# Patient Record
Sex: Male | Born: 1945 | Race: White | Hispanic: No | Marital: Married | Smoking: Never smoker
Health system: Southern US, Community
[De-identification: ages and names within clinical notes are randomized; demographics above are authoritative.]

## PROBLEM LIST (undated history)

## (undated) DIAGNOSIS — R351 Nocturia: Secondary | ICD-10-CM

## (undated) DIAGNOSIS — R3915 Urgency of urination: Secondary | ICD-10-CM

## (undated) DIAGNOSIS — Q211 Atrial septal defect: Secondary | ICD-10-CM

## (undated) DIAGNOSIS — Z973 Presence of spectacles and contact lenses: Secondary | ICD-10-CM

## (undated) DIAGNOSIS — R011 Cardiac murmur, unspecified: Secondary | ICD-10-CM

## (undated) DIAGNOSIS — Z794 Long term (current) use of insulin: Secondary | ICD-10-CM

## (undated) DIAGNOSIS — E119 Type 2 diabetes mellitus without complications: Secondary | ICD-10-CM

## (undated) DIAGNOSIS — I350 Nonrheumatic aortic (valve) stenosis: Secondary | ICD-10-CM

## (undated) DIAGNOSIS — Q2112 Patent foramen ovale: Secondary | ICD-10-CM

## (undated) DIAGNOSIS — N471 Phimosis: Secondary | ICD-10-CM

## (undated) DIAGNOSIS — I10 Essential (primary) hypertension: Secondary | ICD-10-CM

## (undated) DIAGNOSIS — N433 Hydrocele, unspecified: Secondary | ICD-10-CM

## (undated) DIAGNOSIS — E785 Hyperlipidemia, unspecified: Secondary | ICD-10-CM

## (undated) DIAGNOSIS — K430 Incisional hernia with obstruction, without gangrene: Secondary | ICD-10-CM

## (undated) DIAGNOSIS — N489 Disorder of penis, unspecified: Secondary | ICD-10-CM

## (undated) HISTORY — PX: OTHER SURGICAL HISTORY: SHX169

## (undated) HISTORY — PX: TRANSTHORACIC ECHOCARDIOGRAM: SHX275

## (undated) HISTORY — PX: CARDIAC CATHETERIZATION: SHX172

---

## 2015-06-15 ENCOUNTER — Ambulatory Visit: Payer: Self-pay | Admitting: Internal Medicine

## 2015-07-03 ENCOUNTER — Ambulatory Visit: Payer: Self-pay | Admitting: Internal Medicine

## 2015-07-22 ENCOUNTER — Other Ambulatory Visit: Payer: Self-pay | Admitting: General Surgery

## 2015-07-22 DIAGNOSIS — K432 Incisional hernia without obstruction or gangrene: Secondary | ICD-10-CM

## 2015-07-24 ENCOUNTER — Ambulatory Visit: Payer: Self-pay | Admitting: Internal Medicine

## 2015-07-27 ENCOUNTER — Inpatient Hospital Stay: Admission: RE | Admit: 2015-07-27 | Payer: Self-pay | Source: Ambulatory Visit

## 2015-07-29 ENCOUNTER — Ambulatory Visit
Admission: RE | Admit: 2015-07-29 | Discharge: 2015-07-29 | Disposition: A | Discharge status: Home / Self Care | Payer: BLUE CROSS/BLUE SHIELD | Source: Ambulatory Visit | Attending: General Surgery | Admitting: General Surgery

## 2015-07-29 DIAGNOSIS — K432 Incisional hernia without obstruction or gangrene: Secondary | ICD-10-CM

## 2015-07-29 MED ORDER — IOPAMIDOL (ISOVUE-300) INJECTION 61%
125.0000 mL | Freq: Once | INTRAVENOUS | Status: AC | PRN
  Administered 2015-07-29: 125 mL via INTRAVENOUS

## 2015-08-12 ENCOUNTER — Ambulatory Visit: Payer: Self-pay | Admitting: Internal Medicine

## 2015-09-04 ENCOUNTER — Encounter: Payer: Self-pay | Admitting: Internal Medicine

## 2015-09-04 ENCOUNTER — Ambulatory Visit (INDEPENDENT_AMBULATORY_CARE_PROVIDER_SITE_OTHER): Payer: BLUE CROSS/BLUE SHIELD | Admitting: Internal Medicine

## 2015-09-04 VITALS — BP 124/66 | HR 85 | Temp 98.2°F | Resp 12 | Ht 71.0 in | Wt 223.6 lb

## 2015-09-04 DIAGNOSIS — IMO0002 Reserved for concepts with insufficient information to code with codable children: Secondary | ICD-10-CM

## 2015-09-04 DIAGNOSIS — Z794 Long term (current) use of insulin: Secondary | ICD-10-CM | POA: Diagnosis not present

## 2015-09-04 DIAGNOSIS — E1165 Type 2 diabetes mellitus with hyperglycemia: Secondary | ICD-10-CM | POA: Diagnosis not present

## 2015-09-04 DIAGNOSIS — E1142 Type 2 diabetes mellitus with diabetic polyneuropathy: Secondary | ICD-10-CM | POA: Diagnosis not present

## 2015-09-04 LAB — COMPLETE METABOLIC PANEL WITH GFR
ALBUMIN: 4 g/dL (ref 3.6–5.1)
ALK PHOS: 42 U/L (ref 40–115)
ALT: 11 U/L (ref 9–46)
AST: 11 U/L (ref 10–35)
BILIRUBIN TOTAL: 0.3 mg/dL (ref 0.2–1.2)
BUN: 24 mg/dL (ref 7–25)
CALCIUM: 9.1 mg/dL (ref 8.6–10.3)
CO2: 25 mmol/L (ref 20–31)
CREATININE: 1.38 mg/dL — AB (ref 0.70–1.25)
Chloride: 103 mmol/L (ref 98–110)
GFR, Est African American: 60 mL/min (ref >=60)
GFR, Est Non African American: 52 mL/min — ABNORMAL LOW (ref >=60)
GLUCOSE: 110 mg/dL — AB (ref 65–99)
POTASSIUM: 3.9 mmol/L (ref 3.5–5.3)
SODIUM: 140 mmol/L (ref 135–146)
TOTAL PROTEIN: 6.5 g/dL (ref 6.1–8.1)

## 2015-09-04 MED ORDER — METFORMIN HCL 500 MG PO TABS: 1000.0000 mg | ORAL_TABLET | Freq: Two times a day (BID) | ORAL | Status: DC

## 2015-09-04 NOTE — Progress Notes (Addendum)
Patient ID: Elijah Chen, male   DOB: July 04, 1946, 70 y.o.   MRN: [id number]  HPI: Elijah Chen is a 70 y.o.-year-old male, referred by his PCP, Dr.Barnes, for management of DM2, dx in 2006, insulin-dependent since dx, uncontrolled, with complications (peripheral neuropathy). He moved here from [place], where he was followed by endocrinology.  Last hemoglobin A1c was: 04/2015: HbA1c 13% 2015: Hba1c 7%  Pt is on a regimen of: - Lantus 36 units at bedtime - Novolog 13 units 3x a day, before meals - started at the end of last year - Glipizide 1x a day in am  Pt checks his sugars 1x a day and they are (ave 222 for 14 days) - am: 200-250, 270 - 2h after b'fast: n/c - before lunch: 93, 126 - 2h after lunch: n/c - before dinner: n/c - 2h after dinner: n/c - bedtime: n/c - nighttime: n/c No lows. Lowest sugar was 50 - 2 weeks ago; he has hypoglycemia awareness at 70.  Highest sugar was 300-400 (when forgets insulin)  Glucometer: ?  Pt's meals are: - Breakfast: oatmeal or toast + jelly (sugar-free) - Lunch: sandwich - Dinner: salad + ham.  - Snacks: sodas  - no CKD No results found for: BUN, CREATININE - No recent available lipids: No results found for: CHOL, HDL, LDLCALC, LDLDIRECT, TRIG, CHOLHDL  On Pravastatin. - last eye exam was in 2016. No DR.  - no numbness and tingling in his feet. On alpha lipoic acid.  No FH of DM.  ROS: Constitutional: + Both: weight gain (since Christmas)/loss, no fatigue, no subjective hyperthermia/hypothermia, + increased urination, + nocturia Eyes: no blurry vision, no xerophthalmia ENT: no sore throat, no nodules palpated in throat, no dysphagia/odynophagia, no hoarseness, + hypoacusis, + tinnitus Cardiovascular: no CP/SOB/palpitations/leg swelling Respiratory: no cough/SOB Gastrointestinal: no N/V/D/C/+ heartburn Musculoskeletal: no muscle/joint aches Skin: no rashes, + itching Neurological: no  tremors/numbness/tingling/dizziness Psychiatric: no depression/anxiety + Difficulty with erections  No past medical history on file. - we will need to obtain records from PCP. Patient is poor historian.  No past surgical history on file.   Social History   Social History  . Marital Status: Married    Spouse Name: N/A  . Number of Children: n/a   Occupational History  . Not on file.   Social History Main Topics  . Smoking status: Never Smoker   . Smokeless tobacco: Not on file  . Alcohol Use: No  . Drug Use: No   Current Outpatient Rx  Name  Route  Sig  Dispense  Refill  . aspirin 81 MG tablet   Oral   Take 81 mg by mouth daily.         Marland Kitchen LANTUS SOLOSTAR 100 UNIT/ML Solostar Pen   Subcutaneous   Inject 36 Units into the skin daily at 10 pm.       1     Dispense as written.   . Misc Natural Products (APPLE CIDER VINEGAR) TABS   Oral   Take 4 tablets by mouth daily.         Marland Kitchen NOVOLOG FLEXPEN 100 UNIT/ML FlexPen   Subcutaneous   Inject 13 Units into the skin 3 (three) times daily with meals.       2     Dispense as written.    Allergies  Allergen Reactions  . Penicillins Hives   No family history of diabetes.  PE: BP 124/66 mmHg  Pulse 85  Temp(Src) 98.2 F (36.8 C) (Oral)  Resp  12  Ht 5' 11"  (1.803 m)  Wt 223 lb 9.6 oz (101.424 kg)  BMI 31.20 kg/m2  SpO2 96% Wt Readings from Last 3 Encounters:  09/04/15 223 lb 9.6 oz (101.424 kg)   Constitutional: overweight, in NAD Eyes: PERRLA, EOMI, no exophthalmos ENT: moist mucous membranes, no thyromegaly, no cervical lymphadenopathy Cardiovascular: RRR, No MRG Respiratory: CTA B Gastrointestinal: abdomen soft, NT, ND, BS+ Musculoskeletal: no deformities, strength intact in all 4 Skin: moist, warm, no rashes Neurological: no tremor with outstretched hands, DTR normal in all 4  ASSESSMENT: 1. DM2, insulin-dependent, uncontrolled, with complications - Peripheral neuropathy  PLAN:  1. Patient  with long-standing, uncontrolled diabetes, on basal-bolus insulin plus oral antidiabetic regimen (glipizide, unknown dose, will call me to let me know), which became insufficient. It is difficult to extract information from the patient, however, per my understanding, he added NovoLog insulin right before his last hemoglobin A1c, so this might not have reflected the efficacy of NovoLog. Unfortunately, patient only check sugars in the morning, and these are high, and, the few times that he check later in the day, these were significantly lower. - I shortly advised him to start checking 3 times a day, before meals, and also check some sugars at bedtime before next visit - For now, I advised him to increase Lantus and also add metformin. He tells me he does not have a history of chronic kidney disease, but I would like to check a CMP today to confirm. - I suggested to:  Patient Instructions  Please increase Lantus to 40 units at bedtime.  Continue Novolog 13 units before each of he 3 meals.  Continue Glipizide once a day  - let me know what dose you are taking.  Please start Metformin 500 mg with dinner x 4 days. If you tolerate this well, add another Metformin tablet (500 mg) with breakfast x 4 days. If you tolerate this well, add another metformin tablet with dinner (total 1000 mg) x 4 days. If you tolerate this well, add another metformin tablet with breakfast (total 1000 mg). Continue with 1000 mg of metformin 2x a day with breakfast and dinner.  Please return in 1.5 months with your sugar log.   Please stop at the lab.  - Strongly advised him to start checking sugars at different times of the day - check 3 times a day, rotating checks - given sugar log and advised how to fill it and to bring it at next appt  - given foot care handout and explained the principles  - given instructions for hypoglycemia management "15-15 rule"  - advised for yearly eye exams  - Return to clinic in 1.5 mo with  sugar log   Office Visit on 09/04/2015  Component Date Value Ref Range Status  . Sodium 09/04/2015 140  135 - 146 mmol/L Final  . Potassium 09/04/2015 3.9  3.5 - 5.3 mmol/L Final  . Chloride 09/04/2015 103  98 - 110 mmol/L Final  . CO2 09/04/2015 25  20 - 31 mmol/L Final  . Glucose, Bld 09/04/2015 110* 65 - 99 mg/dL Final  . BUN 09/04/2015 24  7 - 25 mg/dL Final  . Creat 09/04/2015 1.38* 0.70 - 1.25 mg/dL Final  . Total Bilirubin 09/04/2015 0.3  0.2 - 1.2 mg/dL Final  . Alkaline Phosphatase 09/04/2015 42  40 - 115 U/L Final  . AST 09/04/2015 11  10 - 35 U/L Final  . ALT 09/04/2015 11  9 - 46 U/L Final  .  Total Protein 09/04/2015 6.5  6.1 - 8.1 g/dL Final  . Albumin 09/04/2015 4.0  3.6 - 5.1 g/dL Final  . Calcium 09/04/2015 9.1  8.6 - 10.3 mg/dL Final  . GFR, Est African American 09/04/2015 60  >=60 mL/min Final  . GFR, Est Non African American 09/04/2015 52* >=60 mL/min Final   Comment:   The estimated GFR is a calculation valid for adults (>=74 years old) that uses the CKD-EPI algorithm to adjust for age and sex. It is   not to be used for children, pregnant women, hospitalized patients,    patients on dialysis, or with rapidly changing kidney function. According to the NKDEP, eGFR >89 is normal, 60-89 shows mild impairment, 30-59 shows moderate impairment, 15-29 shows severe impairment and <15 is ESRD.      CMP is normal except decreased kidney fxn. We can continue the Metformin at the intended dose.  Madelin Headings at 09/07/2015 9:44 AM     Status: Signed       Expand All Collapse All   PT said Dr. Cruzita Lederer wanted some info on his medications Glipizide 56m 1x a day Apple Cider Vinager 452m2 tab 2x a day

## 2015-09-04 NOTE — Patient Instructions (Addendum)
Please increase Lantus to 40 units at bedtime.  Continue Novolog 13 units before each of he 3 meals.  Continue Glipizide once a day  - let me know what dose you are taking.  Please start Metformin 500 mg with dinner x 4 days. If you tolerate this well, add another Metformin tablet (500 mg) with breakfast x 4 days. If you tolerate this well, add another metformin tablet with dinner (total 1000 mg) x 4 days. If you tolerate this well, add another metformin tablet with breakfast (total 1000 mg). Continue with 1000 mg of metformin 2x a day with breakfast and dinner.  Please return in 1.5 months with your sugar log.   Please stop at the lab.  PATIENT INSTRUCTIONS FOR TYPE 2 DIABETES:  **Please join MyChart!** - see attached instructions about how to join if you have not done so already.  DIET AND EXERCISE Diet and exercise is an important part of diabetic treatment.  We recommended aerobic exercise in the form of brisk walking (working between 40-60% of maximal aerobic capacity, similar to brisk walking) for 150 minutes per week (such as 30 minutes five days per week) along with 3 times per week performing 'resistance' training (using various gauge rubber tubes with handles) 5-10 exercises involving the major muscle groups (upper body, lower body and core) performing 10-15 repetitions (or near fatigue) each exercise. Start at half the above goal but build slowly to reach the above goals. If limited by weight, joint pain, or disability, we recommend daily walking in a swimming pool with water up to waist to reduce pressure from joints while allow for adequate exercise.    BLOOD GLUCOSES Monitoring your blood glucoses is important for continued management of your diabetes. Please check your blood glucoses 2-4 times a day: fasting, before meals and at bedtime (you can rotate these measurements - e.g. one day check before the 3 meals, the next day check before 2 of the meals and before bedtime, etc.).    HYPOGLYCEMIA (low blood sugar) Hypoglycemia is usually a reaction to not eating, exercising, or taking too much insulin/ other diabetes drugs.  Symptoms include tremors, sweating, hunger, confusion, headache, etc. Treat IMMEDIATELY with 15 grams of Carbs: . 4 glucose tablets .  cup regular juice/soda . 2 tablespoons raisins . 4 teaspoons sugar . 1 tablespoon honey Recheck blood glucose in 15 mins and repeat above if still symptomatic/blood glucose <100.  RECOMMENDATIONS TO REDUCE YOUR RISK OF DIABETIC COMPLICATIONS: * Take your prescribed MEDICATION(S) * Follow a DIABETIC diet: Complex carbs, fiber rich foods, (monounsaturated and polyunsaturated) fats * AVOID saturated/trans fats, high fat foods, >2,300 mg salt per day. * EXERCISE at least 5 times a week for 30 minutes or preferably daily.  * DO NOT SMOKE OR DRINK more than 1 drink a day. * Check your FEET every day. Do not wear tightfitting shoes. Contact us if you develop an ulcer * See your EYE doctor once a year or more if needed * Get a FLU shot once a year * Get a PNEUMONIA vaccine once before and once after age 44 years  GOALS:  * Your Hemoglobin A1c of <7%  * fasting sugars need to be <130 * after meals sugars need to be <180 (2h after you start eating) * Your Systolic BP should be [id number] or lower  * Your Diastolic BP should be 80 or lower  * Your HDL (Good Cholesterol) should be 40 or higher  * Your LDL (Bad Cholesterol) should be  100 or lower. * Your Triglycerides should be 150 or lower  * Your Urine microalbumin (kidney function) should be <30 * Your Body Mass Index should be 25 or lower    Please consider the following ways to cut down carbs and fat and increase fiber and micronutrients in your diet: - substitute whole grain for white bread or pasta - substitute brown rice for white rice - substitute 90-calorie flat bread pieces for slices of bread when possible - substitute sweet potatoes or yams for white  potatoes - substitute humus for margarine - substitute tofu for cheese when possible - substitute almond or rice milk for regular milk (would not drink soy milk daily due to concern for soy estrogen influence on breast cancer risk) - substitute dark chocolate for other sweets when possible - substitute water - can add lemon or orange slices for taste - for diet sodas (artificial sweeteners will trick your body that you can eat sweets without getting calories and will lead you to overeating and weight gain in the long run) - do not skip breakfast or other meals (this will slow down the metabolism and will result in more weight gain over time)  - can try smoothies made from fruit and almond/rice milk in am instead of regular breakfast - can also try old-fashioned (not instant) oatmeal made with almond/rice milk in am - order the dressing on the side when eating salad at a restaurant (pour less than half of the dressing on the salad) - eat as little meat as possible - can try juicing, but should not forget that juicing will get rid of the fiber, so would alternate with eating raw veg./fruits or drinking smoothies - use as little oil as possible, even when using olive oil - can dress a salad with a mix of balsamic vinegar and lemon juice, for e.g. - use agave nectar, stevia sugar, or regular sugar rather than artificial sweateners - steam or broil/roast veggies  - snack on veggies/fruit/nuts (unsalted, preferably) when possible, rather than processed foods - reduce or eliminate aspartame in diet (it is in diet sodas, chewing gum, etc) Read the labels!  Try to read Dr. Janene Harvey book: "Program for Reversing Diabetes" for other ideas for healthy eating.

## 2015-09-07 ENCOUNTER — Telehealth: Payer: Self-pay | Admitting: Internal Medicine

## 2015-09-07 NOTE — Telephone Encounter (Signed)
PT said Dr. Cruzita Lederer wanted some info on his medications Glipizide 10mg  1x a day Apple Cider Vinager 450mg  2 tab 2x a day

## 2015-09-08 NOTE — Telephone Encounter (Signed)
Noted! Thank you

## 2015-09-08 NOTE — Telephone Encounter (Signed)
Please read message below and be advised.  

## 2015-09-16 LAB — HM DIABETES EYE EXAM

## 2015-09-28 ENCOUNTER — Telehealth: Payer: Self-pay | Admitting: Internal Medicine

## 2015-09-28 MED ORDER — "PEN NEEDLES 5/16"" 31G X 8 MM MISC": Status: DC

## 2015-09-28 MED ORDER — GLUCOSE BLOOD VI STRP: 1.0000 | ORAL_STRIP | Freq: Every day | Status: AC

## 2015-09-28 NOTE — Telephone Encounter (Signed)
Spoke to patient by telephone and was advised that he needs bayer contour test strips and needles for his pens 8 mm-5/16-31 G. Patient stated that he has plenty of lancets and does not need them at this time. Advised patient that scripts will be sent in today and to check with her pharmacy later.  Refills sent to pharmacy electronically

## 2015-09-28 NOTE — Telephone Encounter (Signed)
Pt requests prescriptions for Contour Test Strips and Lancets and also his Syringes for insulin sent into [facility] on [address]

## 2015-09-30 ENCOUNTER — Encounter: Payer: Self-pay | Admitting: Internal Medicine

## 2015-10-16 ENCOUNTER — Encounter: Payer: Self-pay | Admitting: Internal Medicine

## 2015-10-16 ENCOUNTER — Ambulatory Visit: Payer: BLUE CROSS/BLUE SHIELD | Admitting: Internal Medicine

## 2015-10-16 ENCOUNTER — Ambulatory Visit (INDEPENDENT_AMBULATORY_CARE_PROVIDER_SITE_OTHER): Payer: BLUE CROSS/BLUE SHIELD | Admitting: Internal Medicine

## 2015-10-16 ENCOUNTER — Other Ambulatory Visit (INDEPENDENT_AMBULATORY_CARE_PROVIDER_SITE_OTHER): Payer: BLUE CROSS/BLUE SHIELD | Admitting: *Deleted

## 2015-10-16 DIAGNOSIS — E1142 Type 2 diabetes mellitus with diabetic polyneuropathy: Secondary | ICD-10-CM | POA: Diagnosis not present

## 2015-10-16 DIAGNOSIS — IMO0002 Reserved for concepts with insufficient information to code with codable children: Secondary | ICD-10-CM

## 2015-10-16 DIAGNOSIS — E1165 Type 2 diabetes mellitus with hyperglycemia: Secondary | ICD-10-CM

## 2015-10-16 LAB — POCT GLYCOSYLATED HEMOGLOBIN (HGB A1C): Hemoglobin A1C: 9.6

## 2015-10-16 MED ORDER — METFORMIN HCL 1000 MG PO TABS: 1000.0000 mg | ORAL_TABLET | Freq: Two times a day (BID) | ORAL | Status: DC

## 2015-10-16 NOTE — Patient Instructions (Signed)
Please continue: - Lantus 40 units at bedtime. - Glipizide 10 mg once a day in am - Metformin 1000 mg 2x a day with breakfast and dinner  Please change the Novolog as follows: - smaller meal: 12 units - regular meal: 14 units  - larger meal: 16 units  Please return in 1.5 months with your sugar log.

## 2015-10-16 NOTE — Progress Notes (Signed)
Patient ID: Elijah Chen, male   DOB: 15-Jun-1946, 70 y.o.   MRN: [id number]  HPI: Elijah Chen is a 70 y.o.-year-old male, returning for f/u for DM2, dx in 2006, insulin-dependent since dx, uncontrolled, with complications (peripheral neuropathy). He moved here from [place], where he was followed by endocrinology. Last visit 1.5 mo ago.  Last hemoglobin A1c was: 04/2015: HbA1c 13% 2015: Hba1c 7%  Pt is on a regimen of: - Lantus 36 >> 40 units at bedtime - Novolog 13 units 2-(3)x a day, before meals - Metformin 1000 mg 2x a day - added 08/2015 - Glipizide 10 mg 1x a day He also takes Apple Cider Vinegar 450mg  2 tab 2x a day - for neuropathy.  Pt checks his sugars 6-7x a day and they are: - am: 200-250, 270 >> 129-245, 333 if forgetting Lantus - 2h after b'fast: n/c >> 74-256, 548 if has cereals - before lunch: 93, 126 >> 104-554 (higher when in [place]) - 2h after lunch: n/c >> 108-298, 533 - before dinner: n/c >> 73-238, 280 - 2h after dinner: n/c >> 122-235, 280 - bedtime: n/c >> 124, 214-322, 553 - nighttime: n/c No lows. Lowest sugar was 50 >> 74; he has hypoglycemia awareness at 70.  Highest sugar was 300-400 >> 500s (when forgets insulin)  Glucometer: ?  Pt's meals are: - Breakfast: oatmeal or toast + jelly (sugar-free) - Lunch: sandwich - Dinner: salad + ham.  - Snacks: sodas  - + CKD Lab Results  Component Value Date   BUN 24 09/04/2015   CREATININE 1.38* 09/04/2015   - No recent available lipids: No results found for: CHOL, HDL, LDLCALC, LDLDIRECT, TRIG, CHOLHDL  On Pravastatin. - last eye exam was: Abstract on 09/30/2015  Component Date Value Ref Range Status  . HM Diabetic Eye Exam 09/16/2015 No Retinopathy  No Retinopathy Final   - no numbness and tingling in his feet. On alpha lipoic acid.  No FH of DM.  ROS: Constitutional: no weight gain /loss, no fatigue, no subjective hyperthermia/hypothermia, + increased urination, + nocturia Eyes: no blurry  vision, no xerophthalmia ENT: no sore throat, no nodules palpated in throat, + dysphagia/no odynophagia, no hoarseness Cardiovascular: no CP/SOB/palpitations/leg swelling Respiratory: no cough/SOB Gastrointestinal: no N/V/D/C/+ heartburn Musculoskeletal: no muscle/joint aches Skin: no rashes Neurological: no tremors/numbness/tingling/dizziness Psychiatric: no depression/anxiety + Difficulty with erections  No past medical history on file. - we will need to obtain records from PCP.   No past surgical history on file.   Social History   Social History  . Marital Status: Married    Spouse Name: N/A  . Number of Children: n/a   Occupational History  . Not on file.   Social History Main Topics  . Smoking status: Never Smoker   . Smokeless tobacco: Not on file  . Alcohol Use: No  . Drug Use: No   Current Outpatient Rx  Name  Route  Sig  Dispense  Refill  . aspirin 81 MG tablet   Oral   Take 81 mg by mouth daily.         Marland Kitchen glucose blood (BAYER CONTOUR TEST) test strip   Other   1 each by Other route daily. Use as instructed DX E11.65, Z79.4   100 each   1   . Insulin Pen Needle (PEN NEEDLES 31GX5/16") 31G X 8 MM MISC      Use needles 4 times a day as directed. DX E11.65, Z79.4   100 each   1   .  LANTUS SOLOSTAR 100 UNIT/ML Solostar Pen   Subcutaneous   Inject 40 Units into the skin daily at 10 pm.      1     Dispense as written.   . metFORMIN (GLUCOPHAGE) 500 MG tablet   Oral   Take 2 tablets (1,000 mg total) by mouth 2 (two) times daily with a meal.   120 tablet   5   . Misc Natural Products (APPLE CIDER VINEGAR) TABS   Oral   Take 4 tablets by mouth daily.         Marland Kitchen NOVOLOG FLEXPEN 100 UNIT/ML FlexPen   Subcutaneous   Inject 13 Units into the skin 3 (three) times daily with meals.       2     Dispense as written.    Allergies  Allergen Reactions  . Penicillins Hives   No family history of diabetes.  PE: BP 110/60 mmHg  Pulse 64   Temp(Src) 98 F (36.7 C) (Oral)  Resp 12  Wt 224 lb (101.606 kg)  SpO2 98% Wt Readings from Last 3 Encounters:  10/16/15 224 lb (101.606 kg)  09/04/15 223 lb 9.6 oz (101.424 kg)   Constitutional: overweight, in NAD Eyes: PERRLA, EOMI, no exophthalmos ENT: moist mucous membranes, no thyromegaly, no cervical lymphadenopathy Cardiovascular: RRR, No MRG Respiratory: CTA B Gastrointestinal: abdomen soft, NT, ND, BS+ Musculoskeletal: no deformities, strength intact in all 4 Skin: moist, warm, no rashes Neurological: no tremor with outstretched hands, DTR normal in all 4  ASSESSMENT: 1. DM2, insulin-dependent, uncontrolled, with complications - Peripheral neuropathy  PLAN:  1. Patient with long-standing, uncontrolled diabetes, on basal-bolus insulin plus oral antidiabetic regimen (glipizide + metformin started at last visit), With improvement in his sugars since last visit. He did a great job checking his sugars multiple times a day and taking his medicines as prescribed. He went to [place] for a week at the beginning of the month and did not check sugars there, but the sugars are much higher after he returned, which I suspect is due to increased insulin resistance due to mostly eating out and a large quantities. - Since sugars are more fluctuating after meals, I will advise him to change the dose of NovoLog before a meal depending on the size of the meal, if eats dessert, and if he plans to be active after the meal. - I suggested to:  Patient Instructions  Please continue: - Lantus 40 units at bedtime. - Glipizide 10 mg once a day in am - Metformin 1000 mg 2x a day with breakfast and dinner  Please change the Novolog as follows: - smaller meal: 12 units - regular meal: 14 units  - larger meal: 16 units  Please return in 1.5 months with your sugar log.   - continue checking sugars at different times of the day - check 3 times a day, rotating checks - advised for yearly eye exams  - I  will check a lipid panel in the next 3 mo - checked HbA1c today >> 9.6% (better) - Return to clinic in 1.5 mo with sugar log

## 2015-11-04 ENCOUNTER — Other Ambulatory Visit: Payer: Self-pay | Admitting: Family Medicine

## 2015-11-04 DIAGNOSIS — N529 Male erectile dysfunction, unspecified: Secondary | ICD-10-CM | POA: Diagnosis not present

## 2015-11-04 DIAGNOSIS — I10 Essential (primary) hypertension: Secondary | ICD-10-CM | POA: Diagnosis not present

## 2015-11-04 DIAGNOSIS — E119 Type 2 diabetes mellitus without complications: Secondary | ICD-10-CM | POA: Diagnosis not present

## 2015-11-04 DIAGNOSIS — Z125 Encounter for screening for malignant neoplasm of prostate: Secondary | ICD-10-CM | POA: Diagnosis not present

## 2015-11-04 DIAGNOSIS — R5383 Other fatigue: Secondary | ICD-10-CM | POA: Diagnosis not present

## 2015-11-04 DIAGNOSIS — N433 Hydrocele, unspecified: Secondary | ICD-10-CM

## 2015-11-04 DIAGNOSIS — E7800 Pure hypercholesterolemia, unspecified: Secondary | ICD-10-CM | POA: Diagnosis not present

## 2015-11-04 DIAGNOSIS — Z23 Encounter for immunization: Secondary | ICD-10-CM | POA: Diagnosis not present

## 2015-11-04 DIAGNOSIS — Z0000 Encounter for general adult medical examination without abnormal findings: Secondary | ICD-10-CM | POA: Diagnosis not present

## 2015-11-09 ENCOUNTER — Other Ambulatory Visit: Payer: BLUE CROSS/BLUE SHIELD

## 2015-11-11 ENCOUNTER — Telehealth: Payer: Self-pay | Admitting: Internal Medicine

## 2015-11-11 MED ORDER — METFORMIN HCL 1000 MG PO TABS: 1000.0000 mg | ORAL_TABLET | Freq: Two times a day (BID) | ORAL | Status: DC

## 2015-11-11 NOTE — Telephone Encounter (Signed)
Rx submitted per pt's request.  

## 2015-11-11 NOTE — Telephone Encounter (Signed)
Patient need a refill of metformin 1 pill twice a day,  [facility] [place], [place] - [address] [phone] (Phone) [phone] (Fax)

## 2015-11-12 ENCOUNTER — Other Ambulatory Visit: Payer: BLUE CROSS/BLUE SHIELD

## 2015-11-13 ENCOUNTER — Other Ambulatory Visit: Payer: BLUE CROSS/BLUE SHIELD

## 2015-11-18 ENCOUNTER — Ambulatory Visit
Admission: RE | Admit: 2015-11-18 | Discharge: 2015-11-18 | Disposition: A | Discharge status: Home / Self Care | Payer: BLUE CROSS/BLUE SHIELD | Source: Ambulatory Visit | Attending: Family Medicine | Admitting: Family Medicine

## 2015-11-18 DIAGNOSIS — N433 Hydrocele, unspecified: Secondary | ICD-10-CM | POA: Diagnosis not present

## 2015-11-30 ENCOUNTER — Ambulatory Visit (INDEPENDENT_AMBULATORY_CARE_PROVIDER_SITE_OTHER): Payer: BLUE CROSS/BLUE SHIELD | Admitting: Internal Medicine

## 2015-11-30 ENCOUNTER — Encounter: Payer: Self-pay | Admitting: Internal Medicine

## 2015-11-30 VITALS — BP 118/64 | HR 76 | Temp 97.9°F | Resp 12 | Wt 222.0 lb

## 2015-11-30 DIAGNOSIS — IMO0002 Reserved for concepts with insufficient information to code with codable children: Secondary | ICD-10-CM

## 2015-11-30 DIAGNOSIS — E1165 Type 2 diabetes mellitus with hyperglycemia: Secondary | ICD-10-CM

## 2015-11-30 DIAGNOSIS — E1142 Type 2 diabetes mellitus with diabetic polyneuropathy: Secondary | ICD-10-CM | POA: Diagnosis not present

## 2015-11-30 NOTE — Progress Notes (Signed)
Patient ID: Elijah Chen, male   DOB: 11-24-1945, 70 y.o.   MRN: [id number]  HPI: Elijah Chen is a 70 y.o.-year-old male, returning for f/u for DM2, dx in 2006, insulin-dependent since dx, uncontrolled, with complications (peripheral neuropathy). He moved here from [place], where he was followed by endocrinology. Last visit 1.5 mo ago.  Yesterday he had a CBG 22 (took insulin and did not eat dinner!!!!). He started to eat icecream.  Last hemoglobin A1c was: Lab Results  Component Value Date   HGBA1C 9.6 10/16/2015  04/2015: HbA1c 13% 2015: Hba1c 7%  Pt is on a regimen of: - Lantus 36 >> (30)-40 units at bedtime - Novolog: - smaller meal: 12 units - regular meal: 14 units   - larger meal: 16 units - Metformin 1000 mg 2x a day - added 08/2015 - Glipizide 10 mg 1x a day He also takes Apple Cider Vinegar 450mg  2 tab 2x a day - for neuropathy.  Pt checks his sugars 6-7x a day and they are - forgot log - ave <200: - am: 200-250, 270 >> 129-245, 333 if forgetting Lantus >> 160-170, 200s - 2h after b'fast: n/c >> 74-256, 548 if has cereals >> 150s - before lunch: 93, 126 >> 104-554 (higher when in [place]) >> 160s - 2h after lunch: n/c >> 108-298, 533 >> 170s - before dinner: n/c >> 73-238, 280 >> 160s - 2h after dinner: n/c >> 122-235, 280 >> ? - bedtime: n/c >> 124, 214-322, 553 >> ? - nighttime: n/c No lows. Lowest sugar was 50 >> 74 >> 22; he has hypoglycemia awareness at 70.  Highest sugar was 300-400 >> 500s (when forgets insulin)  Glucometer: ?  Pt's meals are: - Breakfast: oatmeal or toast + jelly (sugar-free) but weekends: bacon, pancakes - Lunch: sandwich - Dinner: salad + ham.  - Snacks: sodas  - + CKD Lab Results  Component Value Date   BUN 24 09/04/2015   CREATININE 1.38* 09/04/2015   - No recent available lipids: No results found for: CHOL, HDL, LDLCALC, LDLDIRECT, TRIG, CHOLHDL  On Pravastatin. - last eye exam was: Abstract on 09/30/2015  Component Date  Value Ref Range Status  . HM Diabetic Eye Exam 09/16/2015 No Retinopathy  No Retinopathy Final   - no numbness and tingling in his feet. On alpha lipoic acid. Also takes a B12 vitamin.  No FH of DM.  ROS: Constitutional: no weight gain /loss, no fatigue, no subjective hyperthermia/hypothermia, no nocturia Eyes: no blurry vision, no xerophthalmia ENT: no sore throat, no nodules palpated in throat, no dysphagia/no odynophagia, no hoarseness Cardiovascular: no CP/SOB/palpitations/leg swelling Respiratory: no cough/SOB Gastrointestinal: no N/V/D/C/+ heartburn Musculoskeletal: no muscle/joint aches Skin: no rashes Neurological: no tremors/numbness/tingling/dizziness  I reviewed pt's medications, allergies, PMH, social hx, family hx, and changes were documented in the history of present illness. Otherwise, unchanged from my initial visit note.  No past medical history on file. - we will need to obtain records from PCP.   No past surgical history on file.   Social History   Social History  . Marital Status: Married    Spouse Name: N/A  . Number of Children: n/a   Occupational History  . Not on file.   Social History Main Topics  . Smoking status: Never Smoker   . Smokeless tobacco: Not on file  . Alcohol Use: No  . Drug Use: No   Current Outpatient Rx  Name  Route  Sig  Dispense  Refill  . aspirin  81 MG tablet   Oral   Take 81 mg by mouth daily.         Marland Kitchen glucose blood (BAYER CONTOUR TEST) test strip   Other   1 each by Other route daily. Use as instructed DX E11.65, Z79.4   100 each   1   . Insulin Pen Needle (PEN NEEDLES 31GX5/16") 31G X 8 MM MISC      Use needles 4 times a day as directed. DX E11.65, Z79.4   100 each   1   . LANTUS SOLOSTAR 100 UNIT/ML Solostar Pen   Subcutaneous   Inject 40 Units into the skin daily at 10 pm.      1     Dispense as written.   . metFORMIN (GLUCOPHAGE) 1000 MG tablet   Oral   Take 1 tablet (1,000 mg total) by mouth 2  (two) times daily with a meal.   180 tablet   2   . Misc Natural Products (APPLE CIDER VINEGAR) TABS   Oral   Take 4 tablets by mouth daily.         Marland Kitchen NOVOLOG FLEXPEN 100 UNIT/ML FlexPen   Subcutaneous   Inject 12-18 Units into the skin 3 (three) times daily with meals.      2     Dispense as written.    Allergies  Allergen Reactions  . Penicillins Hives   No family history of diabetes.  PE: BP 118/64 mmHg  Pulse 76  Temp(Src) 97.9 F (36.6 C) (Oral)  Resp 12  Wt 222 lb (100.699 kg)  SpO2 94% Body mass index is 30.98 kg/(m^2). Wt Readings from Last 3 Encounters:  11/30/15 222 lb (100.699 kg)  10/16/15 224 lb (101.606 kg)  09/04/15 223 lb 9.6 oz (101.424 kg)   Constitutional: overweight, in NAD Eyes: PERRLA, EOMI, no exophthalmos ENT: moist mucous membranes, no thyromegaly, no cervical lymphadenopathy Cardiovascular: RRR, No MRG Respiratory: CTA B Gastrointestinal: abdomen soft, NT, ND, BS+ Musculoskeletal: no deformities, strength intact in all 4 Skin: moist, warm, no rashes Neurological: no tremor with outstretched hands, DTR normal in all 4  ASSESSMENT: 1. 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 Patient with long-standing, uncontrolled diabetes, on basal-bolus insulin plus oral antidiabetic regimen (glipizide + metformin started at last visit), with improvement in his sugars (HbA1cdecreased from 13% to 9.6%) after starting to check his sugars multiple times a day and to take his medicines as prescribed.  - at last visit, since sugars were more fluctuating after meals, we changed the doses of NovoLog before a meal depending on the size of the meal, if eats dessert, and if he plans to be active after the meal. His sugars have improved, but he forgot log... - discussed to move Metformin at night as his sugars are high in am and remain high throughout the day >> he would like to continue to  work on his diet for now and continue current regimen - I suggested to:  Patient Instructions  Please continue: - Lantus 40 units at bedtime. - Glipizide 10 mg once a day in am - Metformin 1000 mg 2x a day with breakfast and dinner - Novolog 15 min before meals: - smaller meal: 12 units - regular meal: 14 units  - larger meal: 16 units  Please return in 2 months with your sugar log.   - continue checking sugars at different times of the day - check 3  times a day, rotating checks - advised for yearly eye exams  - I will check a lipid panel at the next visit - Return to clinic in 2 mo with sugar log   2. PN - continue B12 vitamin + Alpha Lipoic Acid

## 2015-11-30 NOTE — Patient Instructions (Signed)
Please continue: - Lantus 40 units at bedtime. - Glipizide 10 mg once a day in am - Metformin 1000 mg 2x a day with breakfast and dinner - Novolog 15 min before meals: - smaller meal: 12 units - regular meal: 14 units  - larger meal: 16 units  Please return in 2 months with your sugar log.

## 2015-12-10 ENCOUNTER — Telehealth: Payer: Self-pay | Admitting: Internal Medicine

## 2015-12-10 MED ORDER — LANTUS SOLOSTAR 100 UNIT/ML ~~LOC~~ SOPN: 40.0000 [IU] | PEN_INJECTOR | Freq: Every day | SUBCUTANEOUS | Status: DC

## 2015-12-10 NOTE — Telephone Encounter (Signed)
Pt needs lantus called into [facility]

## 2015-12-10 NOTE — Telephone Encounter (Signed)
Rx submitted per pt's request.  

## 2015-12-10 NOTE — Addendum Note (Signed)
Addended by: Verlin Grills T on: 12/10/2015 01:45 PM   Modules accepted: Orders

## 2015-12-10 NOTE — Telephone Encounter (Signed)
Pt is completely out

## 2016-01-13 DIAGNOSIS — N430 Encysted hydrocele: Secondary | ICD-10-CM | POA: Diagnosis not present

## 2016-01-13 DIAGNOSIS — N471 Phimosis: Secondary | ICD-10-CM | POA: Diagnosis not present

## 2016-03-21 ENCOUNTER — Other Ambulatory Visit: Payer: Self-pay | Admitting: Internal Medicine

## 2016-03-21 ENCOUNTER — Encounter: Payer: Self-pay | Admitting: Internal Medicine

## 2016-03-21 ENCOUNTER — Ambulatory Visit (INDEPENDENT_AMBULATORY_CARE_PROVIDER_SITE_OTHER): Payer: BLUE CROSS/BLUE SHIELD | Admitting: Internal Medicine

## 2016-03-21 VITALS — BP 136/64 | HR 76 | Temp 98.4°F | Resp 16 | Ht 71.0 in | Wt 222.2 lb

## 2016-03-21 DIAGNOSIS — E1165 Type 2 diabetes mellitus with hyperglycemia: Secondary | ICD-10-CM | POA: Diagnosis not present

## 2016-03-21 DIAGNOSIS — E1142 Type 2 diabetes mellitus with diabetic polyneuropathy: Secondary | ICD-10-CM

## 2016-03-21 DIAGNOSIS — IMO0002 Reserved for concepts with insufficient information to code with codable children: Secondary | ICD-10-CM

## 2016-03-21 LAB — POCT GLYCOSYLATED HEMOGLOBIN (HGB A1C): Hemoglobin A1C: 9.6

## 2016-03-21 MED ORDER — NOVOLOG FLEXPEN 100 UNIT/ML ~~LOC~~ SOPN: 12.0000 [IU] | PEN_INJECTOR | Freq: Three times a day (TID) | SUBCUTANEOUS | 5 refills | Status: DC

## 2016-03-21 MED ORDER — LANTUS SOLOSTAR 100 UNIT/ML ~~LOC~~ SOPN: 45.0000 [IU] | PEN_INJECTOR | Freq: Every day | SUBCUTANEOUS | 5 refills | Status: DC

## 2016-03-21 NOTE — Progress Notes (Signed)
Patient ID: Elijah Chen, male   DOB: 16-Jan-1946, 70 y.o.   MRN: [id number]  HPI: Elijah Chen is a 70 y.o.-year-old male, returning for f/u for DM2, dx in 2006, insulin-dependent since dx, uncontrolled, with complications (peripheral neuropathy). He moved here from [place], where he was followed by endocrinology. Last visit 3.5 mo ago.  Last hemoglobin A1c was: Lab Results  Component Value Date   HGBA1C 9.6 10/16/2015  04/2015: HbA1c 13% 2015: Hba1c 7%  Pt is on a regimen of: - Lantus 40 units at bedtime - Novolog: - smaller meal: 12 units - regular meal: 14 units   - larger meal: 16 units - Metformin 1000 mg 2x a day - added 08/2015 - Glipizide 10 mg 1x a day He also takes Apple Cider Vinegar 450mg  2 tab 2x a day - for neuropathy.  Pt checks his sugars 2x a day and they are: - am: 200-250, 270 >> 129-245, 333 if forgetting Lantus >> 160-170, 200s >> 136, 167, 175-270, 331 - 2h after b'fast: n/c >> 74-256, 548 if has cereals >> 150s >> 252 - before lunch: 93, 126 >> 104-554 (higher when in [place]) >> 160s >> 201-288 - 2h after lunch: n/c >> 108-298, 533 >> 170s >> 78, 158-287 - before dinner: n/c >> 73-238, 280 >> 160s >> 135-303 - 2h after dinner: n/c >> 122-235, 280 >> ? >> 160-325 - bedtime: n/c >> 124, 214-322, 553 >> ? >> 78, 94-261, 347 - nighttime: n/c No lows. Lowest sugar was 50 >> 74 in 11/2015, he had a CBG 22 (took insulin and did not eat dinner!!!!). Lowest now 78. he has hypoglycemia awareness at 70.  Highest sugar was 300-400 >> 500s (when forgets insulin) >> 300s.  Pt's meals are: - Breakfast: oatmeal or toast + jelly (sugar-free) but weekends: bacon, pancakes - Lunch: sandwich - Dinner: salad + ham.  - Snacks: sodas  - + CKD Lab Results  Component Value Date   BUN 24 09/04/2015   CREATININE 1.38 (H) 09/04/2015   - No recent available lipids: No results found for: CHOL, HDL, LDLCALC, LDLDIRECT, TRIG, CHOLHDL  On Pravastatin. - last eye exam  was: Abstract on 09/30/2015  Component Date Value Ref Range Status  . HM Diabetic Eye Exam 09/16/2015 No Retinopathy  No Retinopathy Final   - no numbness and tingling in his feet. On alpha lipoic acid. Also takes a B12 vitamin.  No FH of DM.  ROS: Constitutional: no weight gain /loss, no fatigue, no subjective hyperthermia/hypothermia, + nocturia Eyes: no blurry vision, no xerophthalmia ENT: no sore throat, no nodules palpated in throat, no dysphagia/no odynophagia, no hoarseness Cardiovascular: no CP/SOB/palpitations/leg swelling Respiratory: no cough/SOB Gastrointestinal: no N/V/D/C/heartburn Musculoskeletal: no muscle/joint aches Skin: no rashes Neurological: no tremors/numbness/tingling/dizziness  I reviewed pt's medications, allergies, PMH, social hx, family hx, and changes were documented in the history of present illness. Otherwise, unchanged from my initial visit note.  No past medical history on file. - we will need to obtain records from PCP.   No past surgical history on file.   Social History   Social History  . Marital Status: Married    Spouse Name: N/A   Social History Main Topics  . Smoking status: Never Smoker   . Smokeless tobacco: Not on file  . Alcohol Use: No  . Drug Use: No   Current Outpatient Prescriptions on File Prior to Visit  Medication Sig Dispense Refill  . aspirin 81 MG tablet Take 81 mg by mouth  daily.    . glucose blood (BAYER CONTOUR TEST) test strip 1 each by Other route daily. Use as instructed DX E11.65, Z79.4 100 each 1  . Insulin Pen Needle (PEN NEEDLES 31GX5/16") 31G X 8 MM MISC Use needles 4 times a day as directed. DX E11.65, Z79.4 100 each 1  . LANTUS SOLOSTAR 100 UNIT/ML Solostar Pen Inject 40 Units into the skin daily at 10 pm. 15 mL 1  . lisinopril-hydrochlorothiazide (PRINZIDE,ZESTORETIC) 20-12.5 MG tablet   0  . metFORMIN (GLUCOPHAGE) 1000 MG tablet Take 1 tablet (1,000 mg total) by mouth 2 (two) times daily with a meal.  180 tablet 2  . Misc Natural Products (APPLE CIDER VINEGAR) TABS Take 4 tablets by mouth daily.    Marland Kitchen NOVOLOG FLEXPEN 100 UNIT/ML FlexPen Inject 12-18 Units into the skin 3 (three) times daily with meals.  2  . pravastatin (PRAVACHOL) 80 MG tablet   0   No current facility-administered medications on file prior to visit.     Allergies  Allergen Reactions  . Penicillins Hives   No family history of diabetes.  PE: BP 136/64   Pulse 76   Temp 98.4 F (36.9 C)   Resp 16   Ht 5\' 11"  (1.803 m)   Wt 222 lb 3.2 oz (100.8 kg)   SpO2 96%   BMI 30.99 kg/m  Body mass index is 30.99 kg/m. Wt Readings from Last 3 Encounters:  03/21/16 222 lb 3.2 oz (100.8 kg)  11/30/15 222 lb (100.7 kg)  10/16/15 224 lb (101.6 kg)   Constitutional: overweight, in NAD Eyes: PERRLA, EOMI, no exophthalmos ENT: moist mucous membranes, no thyromegaly, no cervical lymphadenopathy Cardiovascular: RRR, No MRG Respiratory: CTA B Gastrointestinal: abdomen soft, NT, ND, BS+ Musculoskeletal: no deformities, strength intact in all 4 Skin: moist, warm, no rashes Neurological: no tremor with outstretched hands, DTR normal in all 4  ASSESSMENT: 1. 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 Patient with long-standing, uncontrolled diabetes, on basal-bolus insulin plus oral antidiabetic regimen (glipizide + metformin), with improvement in his sugars (HbA1c decreased from 13% to 9.6%) after starting to check his sugars multiple times a day and to take his medicines as prescribed. However, sugars still variable and high at this visit >> will increase Lantus and move it at dinnertime as he forgets it later on, move all Metformin at dinnertime and I will also stop Glipizide as he had some mild lows (70s) after meals. - I suggested to:  Patient Instructions  Please continue: - Novolog 15 min before meals: - smaller meal: 12 units - regular meal: 14  units  - larger meal: 16 units  Please stop Glipizide.  Please increase Lantus to 45 units after dinner.  Please move all Metformin immediately after dinner: 2000 mg  Please return in 1.5 months with your sugar log.   - continue checking sugars at different times of the day - check 3 times a day, rotating checks - advised for yearly eye exams  >> he is UTD - needs a lipid panel at the next visit (now after lunch) - Return to clinic in 3 mo with sugar log   2. PN - continue B12 vitamin + Alpha Lipoic Acid - also takes apple cider vinegar >> feels much better  Philemon Kingdom, MD PhD [facility] Endocrinology

## 2016-03-21 NOTE — Patient Instructions (Signed)
Please continue: - Novolog 15 min before meals: - smaller meal: 12 units - regular meal: 14 units  - larger meal: 16 units  Please stop Glipizide.  Please increase Lantus to 45 units after dinner.  Please move all Metformin immediately after dinner: 2000 mg  Please return in 1.5 months with your sugar log.

## 2016-05-03 DIAGNOSIS — E119 Type 2 diabetes mellitus without complications: Secondary | ICD-10-CM | POA: Diagnosis not present

## 2016-05-03 DIAGNOSIS — I10 Essential (primary) hypertension: Secondary | ICD-10-CM | POA: Diagnosis not present

## 2016-05-03 DIAGNOSIS — E7800 Pure hypercholesterolemia, unspecified: Secondary | ICD-10-CM | POA: Diagnosis not present

## 2016-05-03 DIAGNOSIS — Z23 Encounter for immunization: Secondary | ICD-10-CM | POA: Diagnosis not present

## 2016-05-17 ENCOUNTER — Ambulatory Visit (INDEPENDENT_AMBULATORY_CARE_PROVIDER_SITE_OTHER): Payer: BLUE CROSS/BLUE SHIELD | Admitting: Internal Medicine

## 2016-05-17 ENCOUNTER — Encounter: Payer: Self-pay | Admitting: Internal Medicine

## 2016-05-17 VITALS — BP 120/64 | HR 68 | Ht 71.0 in | Wt 223.0 lb

## 2016-05-17 DIAGNOSIS — E1142 Type 2 diabetes mellitus with diabetic polyneuropathy: Secondary | ICD-10-CM

## 2016-05-17 DIAGNOSIS — E1165 Type 2 diabetes mellitus with hyperglycemia: Secondary | ICD-10-CM

## 2016-05-17 DIAGNOSIS — IMO0002 Reserved for concepts with insufficient information to code with codable children: Secondary | ICD-10-CM

## 2016-05-17 NOTE — Patient Instructions (Addendum)
Please continue: - Lantus 45 units after dinner. You may increase to 50 units if the sugars in am are consistently >140. - Novolog 15 min before meals: - smaller meal: 12 units - regular meal: 14 units  - larger meal: 16 units - Metformin immediately after dinner: 2000 mg  Please return in 3 months with your sugar log.

## 2016-05-17 NOTE — Progress Notes (Signed)
Patient ID: Elijah Chen, male   DOB: Dec 20, 1945, 70 y.o.   MRN: [id number]  HPI: Elijah Chen is a 70 y.o.-year-old male, returning for f/u for DM2, dx in 2006, insulin-dependent since dx, uncontrolled, with complications (peripheral neuropathy). He moved here from [place], where he was followed by endocrinology. Last visit 2 mo ago.  Last hemoglobin A1c was: Lab Results  Component Value Date   HGBA1C 9.6 03/21/2016   HGBA1C 9.6 10/16/2015  04/2015: HbA1c 13% 2015: Hba1c 7%  Pt is on a regimen of: - Novolog 15 min before meals: - smaller meal: 12 units - regular meal: 14 units  - larger meal: 16 units - Lantus 45 units after dinner. - Metformin immediately after dinner: 2000 mg He also takes Gaffer 450mg  2 tab 2x a day - for neuropathy.  Pt checks his sugars 2x a day and they are: - am: 129-245, 333 if forgetting Lantus >> 160-170, 200s >> 136, 167, 175-270, 331 >> 110-175, 190 (if forgets Lantus) - 2h after b'fast: n/c >> 74-256, 548 if has cereals >> 150s >> 252 >> n/c - before lunch: 93, 126 >> 104-554 (higher when in [place]) >> 160s >> 201-288 >> 120-155 - 2h after lunch: n/c >> 108-298, 533 >> 170s >> 78, 158-287 >> n/c - before dinner: n/c >> 73-238, 280 >> 160s >> 135-303 >> 130-145 - 2h after dinner: n/c >> 122-235, 280 >> ? >> 160-325 >> 150-175 - bedtime: n/c >> 124, 214-322, 553 >> ? >> 78, 94-261, 347 >> n/c - nighttime: n/c No lows. Lowest sugar was 50 >> 74; in 11/2015, he had a CBG 22 (took insulin and did not eat dinner!!!!). Lowest now 52 x1 at night (skipped dinner). he has hypoglycemia awareness at 70.  Highest sugar was 300-400 >> 500s (when forgets insulin) >> 300s.  Pt's meals are: - Breakfast: oatmeal or toast + jelly (sugar-free) but weekends: bacon, pancakes - Lunch: sandwich - Dinner: salad + ham.  - Snacks: sodas  - + CKD Lab Results  Component Value Date   BUN 24 09/04/2015   CREATININE 1.38 (H) 09/04/2015   - No recent available  lipids: No results found for: CHOL, HDL, LDLCALC, LDLDIRECT, TRIG, CHOLHDL  On Pravastatin. - last eye exam was: Abstract on 09/30/2015  Component Date Value Ref Range Status  . HM Diabetic Eye Exam 09/16/2015 No Retinopathy  No Retinopathy Final   - no numbness and tingling in his feet. On alpha lipoic acid. Also takes a B12 vitamin.  No FH of DM.  ROS: Constitutional: no weight gain /loss, no fatigue, no subjective hyperthermia/hypothermia, + nocturia Eyes: no blurry vision, no xerophthalmia ENT: no sore throat, no nodules palpated in throat, no dysphagia/no odynophagia, no hoarseness Cardiovascular: no CP/SOB/palpitations/leg swelling Respiratory: no cough/SOB Gastrointestinal: no N/V/D/C/heartburn Musculoskeletal: no muscle/joint aches Skin: no rashes Neurological: no tremors/numbness/tingling/dizziness  I reviewed pt's medications, allergies, PMH, social hx, family hx, and changes were documented in the history of present illness. Otherwise, unchanged from my initial visit note.  No past medical history on file. - we will need to obtain records from PCP.   No past surgical history on file.   Social History   Social History  . Marital Status: Married    Spouse Name: N/A   Social History Main Topics  . Smoking status: Never Smoker   . Smokeless tobacco: Not on file  . Alcohol Use: No  . Drug Use: No   Current Outpatient Prescriptions on File Prior  to Visit  Medication Sig Dispense Refill  . aspirin 81 MG tablet Take 81 mg by mouth daily.    Marland Kitchen glucose blood (BAYER CONTOUR TEST) test strip 1 each by Other route daily. Use as instructed DX E11.65, Z79.4 100 each 1  . Insulin Pen Needle (PEN NEEDLES 31GX5/16") 31G X 8 MM MISC Use needles 4 times a day as directed. DX E11.65, Z79.4 100 each 1  . LANTUS SOLOSTAR 100 UNIT/ML Solostar Pen Inject 45 Units into the skin daily after supper. 15 mL 5  . lisinopril-hydrochlorothiazide (PRINZIDE,ZESTORETIC) 20-12.5 MG tablet   0   . metFORMIN (GLUCOPHAGE) 1000 MG tablet Take 1 tablet (1,000 mg total) by mouth 2 (two) times daily with a meal. 180 tablet 2  . Misc Natural Products (APPLE CIDER VINEGAR) TABS Take 4 tablets by mouth daily.    Marland Kitchen NOVOLOG FLEXPEN 100 UNIT/ML FlexPen Inject 12-18 Units into the skin 3 (three) times daily with meals. 15 mL 5  . pravastatin (PRAVACHOL) 80 MG tablet   0   No current facility-administered medications on file prior to visit.     Allergies  Allergen Reactions  . Penicillins Hives   No family history of diabetes.  PE: BP 120/64 (BP Location: Left Arm, Patient Position: Sitting)   Pulse 68   Ht 5\' 11"  (1.803 m)   Wt 223 lb (101.2 kg)   SpO2 98%   BMI 31.10 kg/m  Body mass index is 31.1 kg/m. Wt Readings from Last 3 Encounters:  05/17/16 223 lb (101.2 kg)  03/21/16 222 lb 3.2 oz (100.8 kg)  11/30/15 222 lb (100.7 kg)   Constitutional: overweight, in NAD Eyes: PERRLA, EOMI, no exophthalmos ENT: moist mucous membranes, no thyromegaly, no cervical lymphadenopathy Cardiovascular: RRR, No MRG Respiratory: CTA B Gastrointestinal: abdomen soft, NT, ND, BS+ Musculoskeletal: no deformities, strength intact in all 4 Skin: moist, warm, no rashes Neurological: no tremor with outstretched hands, DTR normal in all 4  ASSESSMENT: 1. 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 Patient with long-standing, uncontrolled diabetes, on basal-bolus insulin plus oral antidiabetic regimen (glipizide + metformin), with improvement in his sugars since last visit (HbA1c initially decreased from 13% to 9.6% and now sugars are even better after we changed the regimen at last visit). - His sugars are higher in am, especially if he forgets Lantus at night. I will advise him to try to increase the dose of Lantus if he notices that his sugars are consistently high in am despite taking the insulin consistently. - I suggested to:   Patient Instructions  Please continue: - Lantus 45 units after dinner. You may increase to 50 units if the sugars in am are consistently >140. - Novolog 15 min before meals: - smaller meal: 12 units - regular meal: 14 units  - larger meal: 16 units - Metformin immediately after dinner: 2000 mg  Please return in 3 months with your sugar log.   - continue checking sugars at different times of the day - check 3 times a day, rotating checks - advised for yearly eye exams  >> he is UTD - he had recent labs from PCP >> will need to get the results - Return to clinic in 3 mo with sugar log   2. PN - continue B12 vitamin + Alpha Lipoic Acid - also takes apple cider vinegar >> better  Philemon Kingdom, MD PhD [facility] Endocrinology

## 2016-06-02 IMAGING — CT CT ABD-PELV W/ CM
3 of 5 series · 13 of 36 positions shown, 19 images · IV contrast (READICAT/WATER & [ID] ISOVUE 300)
Comparison: None.

CLINICAL DATA: Enlarging umbilical hernia.

EXAM:
CT ABDOMEN AND PELVIS WITH CONTRAST
TECHNIQUE: Multidetector CT imaging of the abdomen and pelvis was performed
using the standard protocol following bolus administration of
intravenous contrast.
CONTRAST:  125mL USMFQ5-NEE IOPAMIDOL (USMFQ5-NEE) INJECTION 61%

[Series 3: abd/pelvis with · axial · 0.87mm/px · z∈[-398,-18]mm · 7 of 102 slices shown, 12 images]
[im 13/102  soft-tissue]
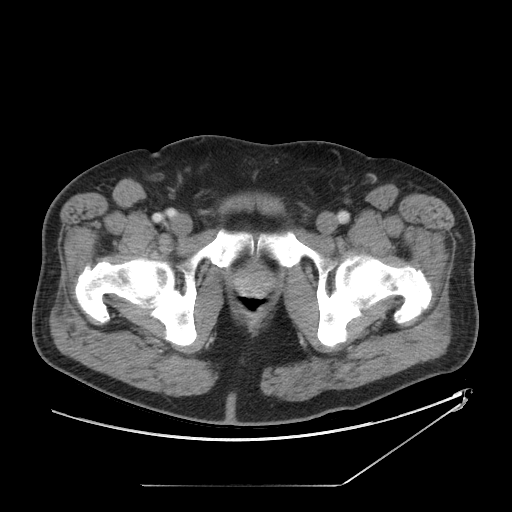
[im 13/102  bone]
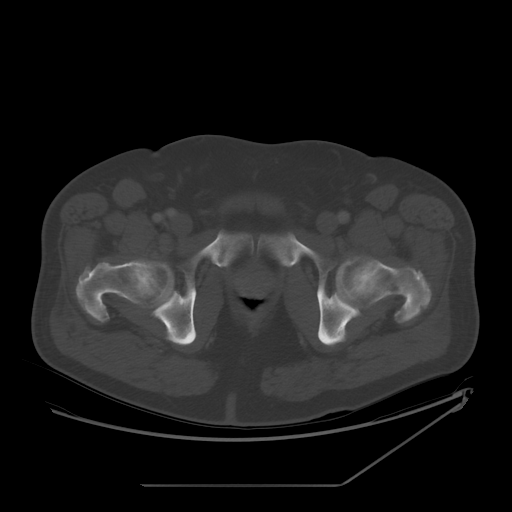
[im 26/102  soft-tissue]
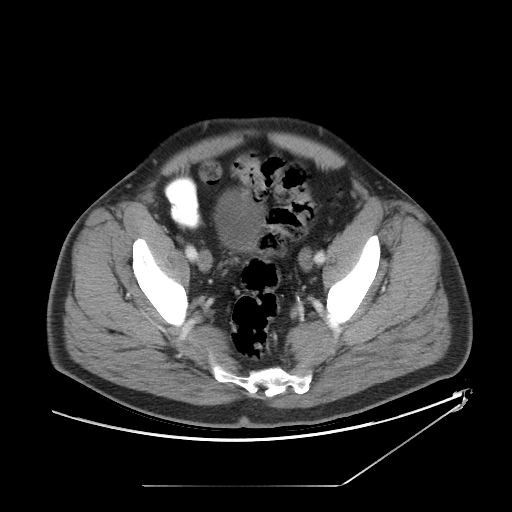
[im 38/102  soft-tissue]
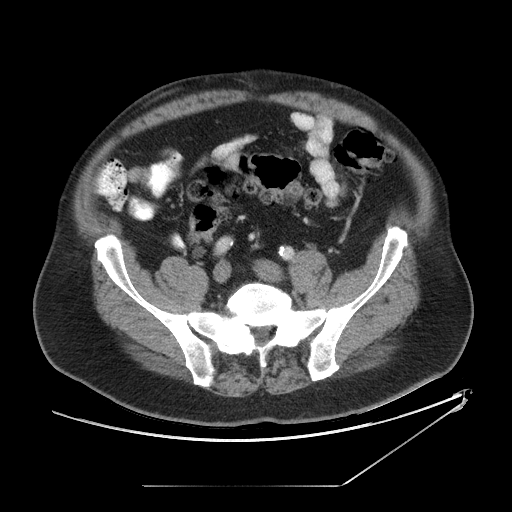
[im 51/102  soft-tissue]
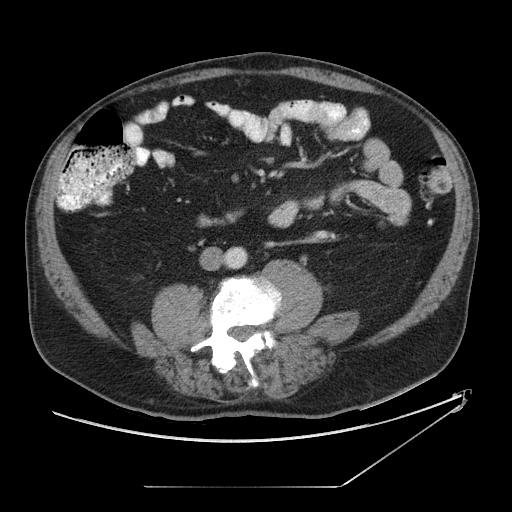
[im 51/102  lung]
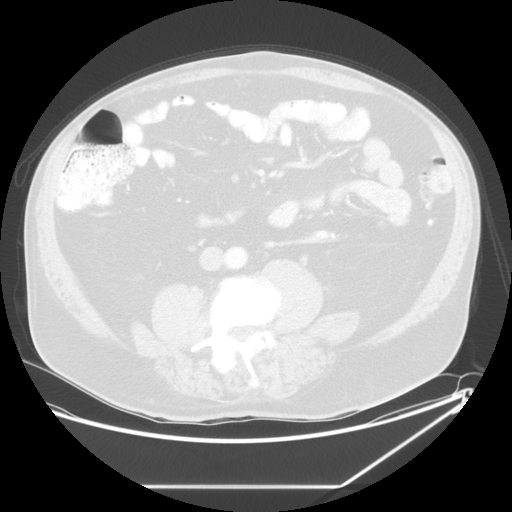
[im 64/102  soft-tissue]
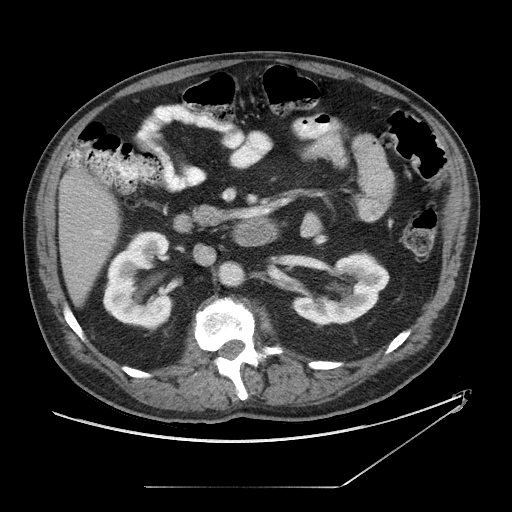
[im 64/102  lung]
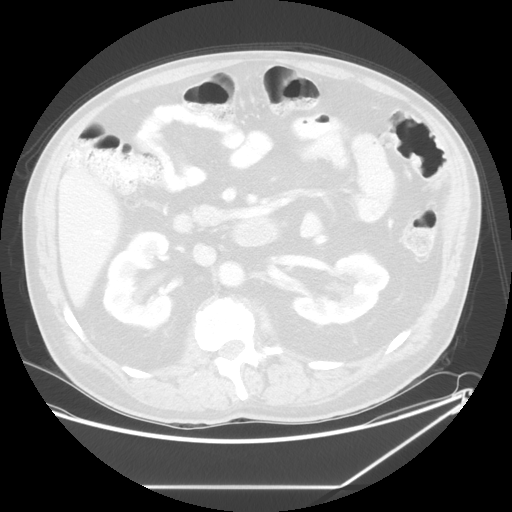
[im 76/102  soft-tissue]
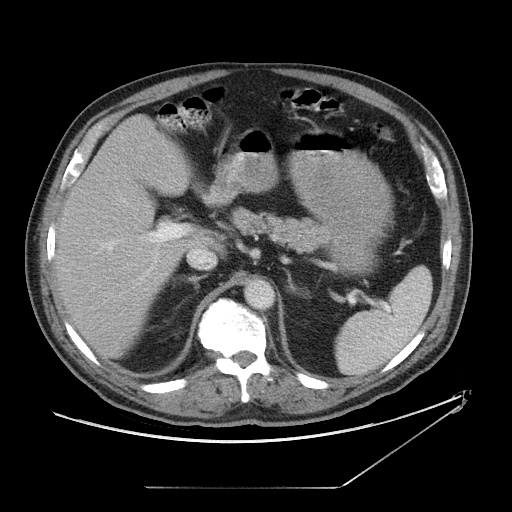
[im 76/102  lung]
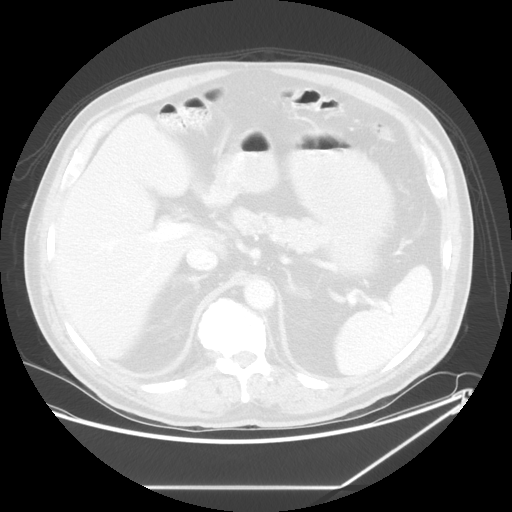
[im 89/102  soft-tissue]
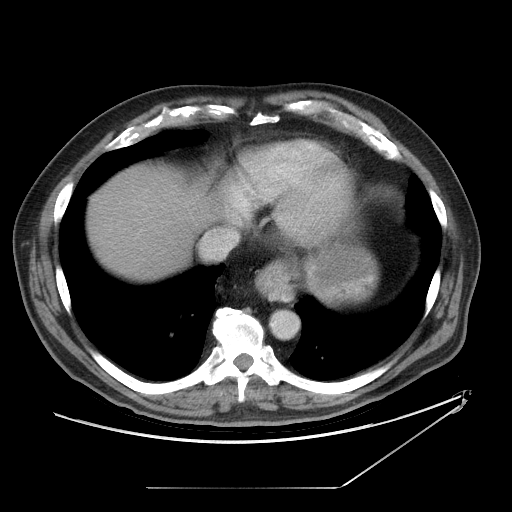
[im 89/102  lung]
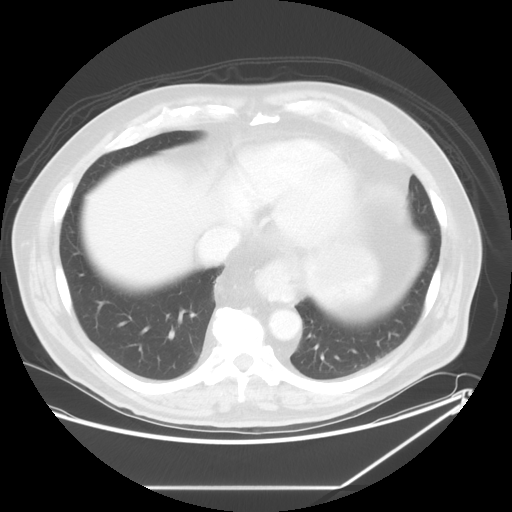

[Series 601: coronal body · coronal · 1.00mm/px · 1 of 134 slices shown, 2 images]
[im 45/134  soft-tissue]
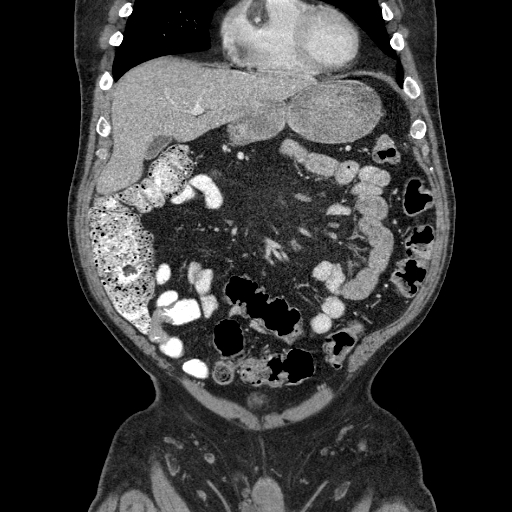
[im 45/134  bone]
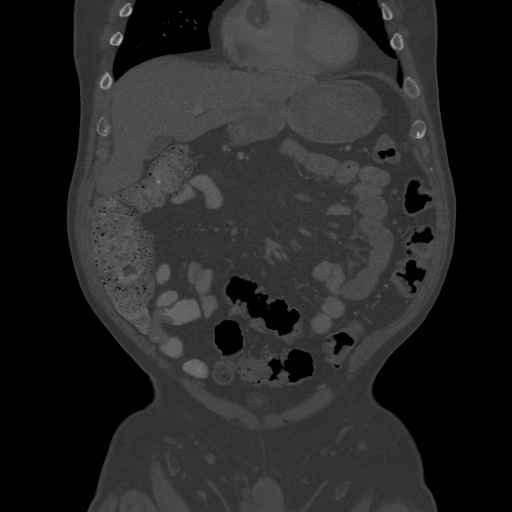

[Series 602: sagittal body · sagittal · 1.00mm/px · 5 of 174 slices shown]
[im 11/174  soft-tissue]
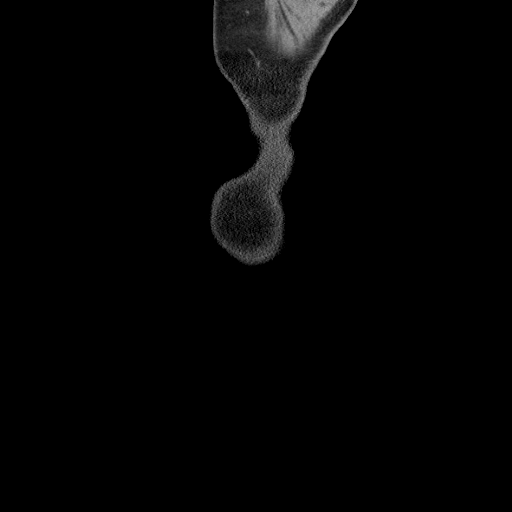
[im 33/174  soft-tissue]
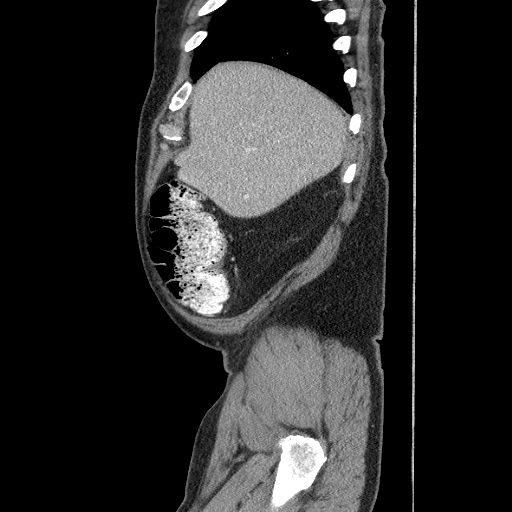
[im 55/174  soft-tissue]
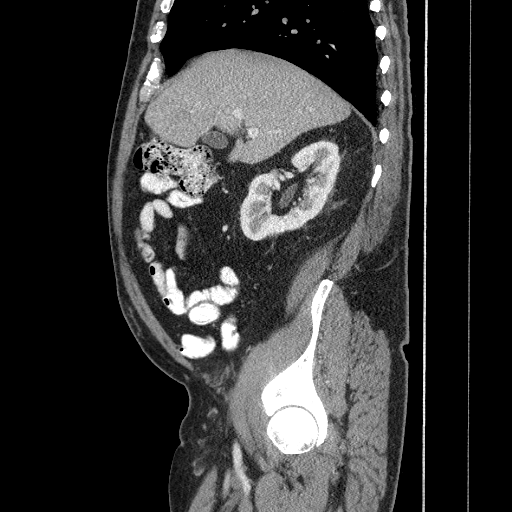
[im 76/174  soft-tissue]
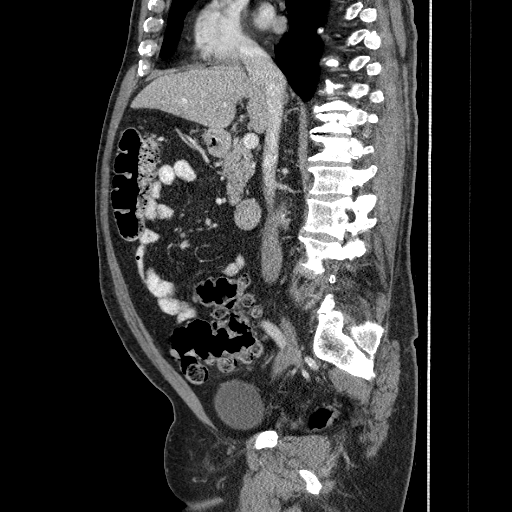
[im 98/174  soft-tissue]
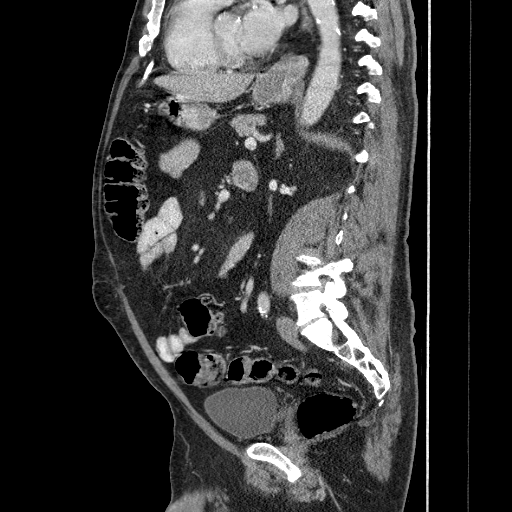

[13 of 36 positions shown; findings below may reference images not displayed]

FINDINGS: Ventral hernia present with dominant umbilical hernia and separate
supraumbilical hernia immediately superior to the umbilicus. The
conglomerate hernia defect measures approximately 3 x 5 cm
transversely and 8 cm in height. Both hernia defects contain fat and
a few vessels with no evidence of extension of bowel through the
abdominal wall. No abnormal fluid is seen associated with the
hernias.

No evidence of bowel obstruction. Diverticulosis of the sigmoid and
descending colon present without evidence of diverticulitis. No
masses or enlarged lymph nodes are seen. No acute inflammatory
process is seen.

There is a moderate-sized hiatal hernia. The liver, gallbladder,
pancreas, spleen, adrenal glands and kidneys are within normal
limits. The abdominal aorta demonstrates calcified plaque without
evidence of aneurysmal disease. The bladder is unremarkable. Diffuse
spondylosis of the spine present with an associated mild scoliosis.
No fractures or bony lesions identified.
IMPRESSION: Ventral hernia defects with essentially two focal hernias on top of
one another. The larger more inferior hernia is at the level of the
umbilicus. There is an additional small hernia defect of the midline
abdominal wall just superior to the umbilicus. Both regions contain
fat and there is no evidence of extension of bowel through the
abdominal wall. No abnormal inflammatory process or fluid
collections are seen

## 2016-06-03 ENCOUNTER — Other Ambulatory Visit: Payer: Self-pay | Admitting: Internal Medicine

## 2016-08-17 ENCOUNTER — Ambulatory Visit (INDEPENDENT_AMBULATORY_CARE_PROVIDER_SITE_OTHER): Payer: BLUE CROSS/BLUE SHIELD | Admitting: Internal Medicine

## 2016-08-17 ENCOUNTER — Ambulatory Visit: Payer: BLUE CROSS/BLUE SHIELD | Admitting: Endocrinology

## 2016-08-17 ENCOUNTER — Encounter: Payer: Self-pay | Admitting: Internal Medicine

## 2016-08-17 VITALS — BP 134/74 | HR 59 | Ht 69.0 in | Wt 223.0 lb

## 2016-08-17 DIAGNOSIS — E1142 Type 2 diabetes mellitus with diabetic polyneuropathy: Secondary | ICD-10-CM

## 2016-08-17 DIAGNOSIS — E1165 Type 2 diabetes mellitus with hyperglycemia: Secondary | ICD-10-CM | POA: Diagnosis not present

## 2016-08-17 DIAGNOSIS — IMO0002 Reserved for concepts with insufficient information to code with codable children: Secondary | ICD-10-CM

## 2016-08-17 LAB — POCT GLYCOSYLATED HEMOGLOBIN (HGB A1C): Hemoglobin A1C: 9.6

## 2016-08-17 MED ORDER — SEMAGLUTIDE(0.25 OR 0.5MG/DOS) 2 MG/1.5ML ~~LOC~~ SOPN: 0.2500 mg | PEN_INJECTOR | SUBCUTANEOUS | 5 refills | Status: DC

## 2016-08-17 NOTE — Patient Instructions (Addendum)
Please continue: - Lantus 45 units after dinner. - Novolog 15 min before meals: - smaller meal: 12 units - regular meal: 14 units  - larger meal: 16 units - Metformin immediately after dinner: 2000 mg  Start Ozempic 0.25 mg under skin in am once a week x4 weeks, then 0.5 mg for another 4 weeks. Call me for refills.  Please return in 1.5 months with your sugar log.

## 2016-08-17 NOTE — Addendum Note (Signed)
Addended by: Caprice Beaver T on: 08/17/2016 11:20 AM   Modules accepted: Orders

## 2016-08-17 NOTE — Progress Notes (Signed)
Patient ID: Elijah Chen, male   DOB: 1945/11/07, 71 y.o.   MRN: [id number]  HPI: Elijah Chen is a 71 y.o.-year-old male, returning for f/u for DM2, dx in 2006, insulin-dependent since dx, uncontrolled, with complications (peripheral neuropathy). He moved here from [place], where he was followed by endocrinology. Last visit 3 mo ago.  He was more anxious lately >> eats when stressed.  Last hemoglobin A1c was: Lab Results  Component Value Date   HGBA1C 9.6 03/21/2016   HGBA1C 9.6 10/16/2015  04/2015: HbA1c 13% 2015: Hba1c 7%  Pt is on a regimen of:   - Lantus 45 units after dinner. - Novolog 15 min before meals: - smaller meal: 12 units - regular meal: 14 units  - larger meal: 16 units - Metformin immediately after dinner: 2000 mg He also takes Gaffer 450mg  2 tab 2x a day - for neuropathy.  Pt checks his sugars 2x a day and they are: - am: 160-170, 200s >> 136, 167, 175-270, 331 >> 110-175, 190 (if forgets Lantus) >> 250 - 2h after b'fast: n/c >> 74-256, 548 if has cereals >> 150s >> 252 >> n/c - before lunch: 93, 126 >> 104-554 (higher when in [place]) >> 160s >> 201-288 >> 120-155 >> 150 - 2h after lunch: n/c >> 108-298, 533 >> 170s >> 78, 158-287 >> n/c - before dinner: n/c >> 73-238, 280 >> 160s >> 135-303 >> 130-145 >> >200 - 2h after dinner: n/c >> 122-235, 280 >> ? >> 160-325 >> 150-175 >> n/c - bedtime: n/c >> 124, 214-322, 553 >> ? >> 78, 94-261, 347 >> n/c - nighttime: n/c No lows. Lowest sugar was 50 >> 74; in 11/2015, he had a CBG 22 (took insulin and did not eat dinner!!!!). Lowest now 52 x1 at night (skipped dinner) >> 79. he has hypoglycemia awareness at 70.  Highest sugar was 300-400 >> 500s (when forgets insulin) >> 300s >> 260.  Pt's meals are: - Breakfast: oatmeal or toast + jelly (sugar-free) but weekends: bacon, pancakes - Lunch: sandwich - Dinner: salad + ham.  - Snacks: sodas  - + CKD Lab Results  Component Value Date   BUN 24 09/04/2015    CREATININE 1.38 (H) 09/04/2015   - No recent available lipids: No results found for: CHOL, HDL, LDLCALC, LDLDIRECT, TRIG, CHOLHDL  On Pravastatin. - last eye exam was: Abstract on 09/30/2015  Component Date Value Ref Range Status  . HM Diabetic Eye Exam 09/16/2015 No Retinopathy  No Retinopathy Final   - no numbness and tingling in his feet. On alpha lipoic acid. Also takes a B12 vitamin.  No FH of DM.  ROS: Constitutional: no weight gain /loss, no fatigue, no subjective hyperthermia/hypothermia, + nocturia Eyes: no blurry vision, no xerophthalmia ENT: no sore throat, no nodules palpated in throat, no dysphagia/no odynophagia, no hoarseness Cardiovascular: no CP/SOB/palpitations/leg swelling Respiratory: no cough/SOB Gastrointestinal: no N/V/D/C/heartburn Musculoskeletal: no muscle/joint aches Skin: no rashes Neurological: no tremors/numbness/tingling/dizziness  I reviewed pt's medications, allergies, PMH, social hx, family hx, and changes were documented in the history of present illness. Otherwise, unchanged from my initial visit note.  No past medical history on file. - we will need to obtain records from PCP.   No past surgical history on file.   Social History   Social History  . Marital Status: Married    Spouse Name: N/A   Social History Main Topics  . Smoking status: Never Smoker   . Smokeless tobacco: Not on file  .  Alcohol Use: No  . Drug Use: No   Current Outpatient Prescriptions on File Prior to Visit  Medication Sig Dispense Refill  . aspirin 81 MG tablet Take 81 mg by mouth daily.    Marland Kitchen glucose blood (BAYER CONTOUR TEST) test strip 1 each by Other route daily. Use as instructed DX E11.65, Z79.4 100 each 1  . Insulin Pen Needle (PEN NEEDLES 31GX5/16") 31G X 8 MM MISC Use needles 4 times a day as directed. DX E11.65, Z79.4 100 each 1  . LANTUS SOLOSTAR 100 UNIT/ML Solostar Pen Inject 45 Units into the skin daily after supper. 15 mL 5  .  lisinopril-hydrochlorothiazide (PRINZIDE,ZESTORETIC) 20-12.5 MG tablet   0  . metFORMIN (GLUCOPHAGE) 1000 MG tablet Take 1 tablet (1,000 mg total) by mouth 2 (two) times daily with a meal. 180 tablet 2  . Misc Natural Products (APPLE CIDER VINEGAR) TABS Take 4 tablets by mouth daily.    Marland Kitchen NOVOLOG FLEXPEN 100 UNIT/ML FlexPen Inject 12-18 Units into the skin 3 (three) times daily with meals. 15 mL 5  . pravastatin (PRAVACHOL) 80 MG tablet   0   No current facility-administered medications on file prior to visit.     Allergies  Allergen Reactions  . Penicillins Hives   No family history of diabetes.  PE: BP 134/74 (BP Location: Left Arm, Patient Position: Sitting)   Pulse (!) 59   Ht 5\' 9"  (1.753 m)   Wt 223 lb (101.2 kg)   SpO2 98%   BMI 32.93 kg/m  Body mass index is 32.93 kg/m. Wt Readings from Last 3 Encounters:  08/17/16 223 lb (101.2 kg)  05/17/16 223 lb (101.2 kg)  03/21/16 222 lb 3.2 oz (100.8 kg)   Constitutional: overweight, in NAD Eyes: PERRLA, EOMI, no exophthalmos ENT: moist mucous membranes, no thyromegaly, no cervical lymphadenopathy Cardiovascular: RRR, No MRG Respiratory: CTA B Gastrointestinal: abdomen soft, NT, ND, BS+ Musculoskeletal: no deformities, strength intact in all 4 Skin: moist, warm, no rashes Neurological: no tremor with outstretched hands, DTR normal in all 4  ASSESSMENT: 1. 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 Patient with long-standing, uncontrolled diabetes, on basal-bolus insulin plus oral antidiabetic regimen (glipizide + metformin). HbA1c initially decreased from 13% to 9.6%. Today, still 9.6% (stable). - His sugars are still high throughout the day >> will start a GLP1 R agonist >> Ozempic. - I suggested to:  Patient Instructions  Please continue: - Lantus 45 units after dinner. - Novolog 15 min before meals: - smaller meal: 12 units - regular meal: 14  units  - larger meal: 16 units - Metformin immediately after dinner: 2000 mg  Start Ozempic 0.25 mg under skin in am once a week x4 weeks, then 0.5 mg for another 4 weeks. Call me for refills.  Please return in 1.5 months with your sugar log.   - continue checking sugars at different times of the day - check 3 times a day, rotating checks - advised for yearly eye exams  >> he is UTD - he had labs from PCP >> will need to get the results - Return to clinic in 3 mo with sugar log   2. PN - continue B12 vitamin + Alpha Lipoic Acid - bothering him more after lunch  Philemon Kingdom, MD PhD [facility]

## 2016-08-18 ENCOUNTER — Ambulatory Visit: Payer: BLUE CROSS/BLUE SHIELD | Admitting: Internal Medicine

## 2016-09-01 ENCOUNTER — Other Ambulatory Visit: Payer: Self-pay | Admitting: Internal Medicine

## 2016-09-01 DIAGNOSIS — R05 Cough: Secondary | ICD-10-CM | POA: Diagnosis not present

## 2016-09-01 NOTE — Telephone Encounter (Signed)
Pt needs this refill called in soon please he has none left

## 2016-09-22 IMAGING — US US SCROTUM
1 series · 14 of 25 positions shown · non-contrast
Comparison: None.

CLINICAL DATA: Hydrocele

EXAM:
ULTRASOUND OF SCROTUM
TECHNIQUE: Complete ultrasound examination of the testicles, epididymis, and
other scrotal structures was performed.

[Series 1: us scrotum · 0.11mm/px · 14 of 72 slices shown]
[im 1/72]
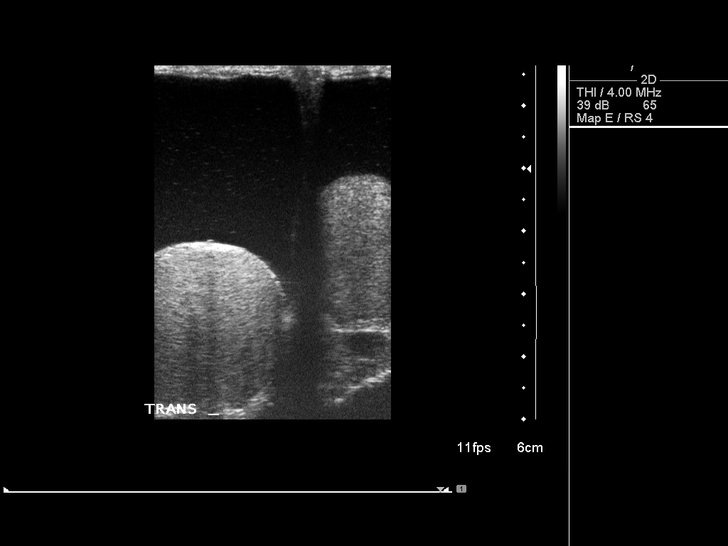
[im 6/72]
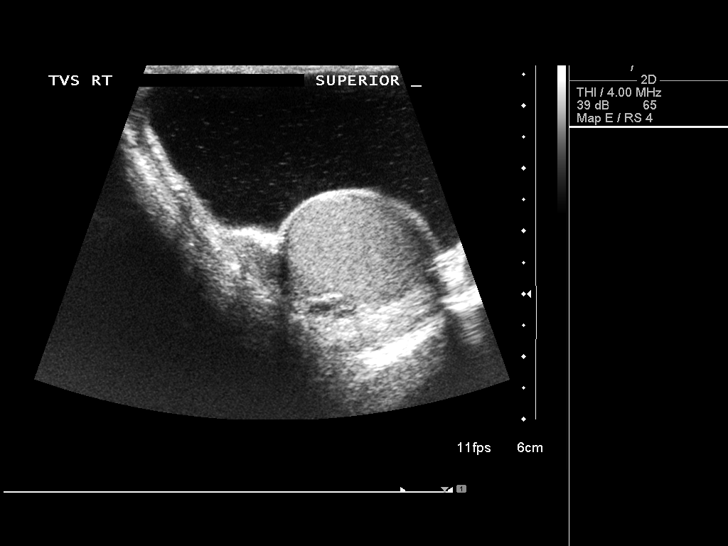
[im 12/72]
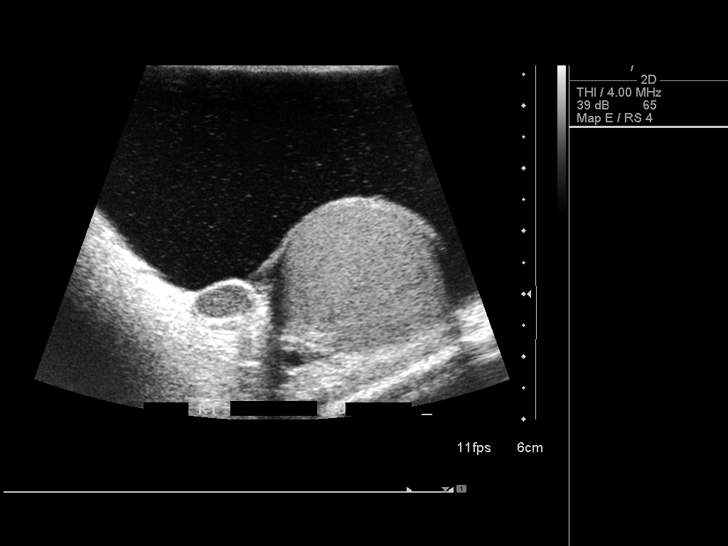
[im 18/72]
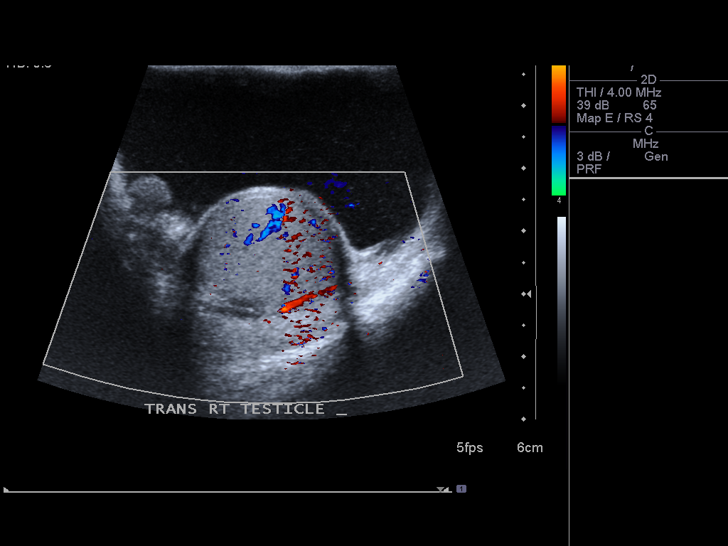
[im 24/72]
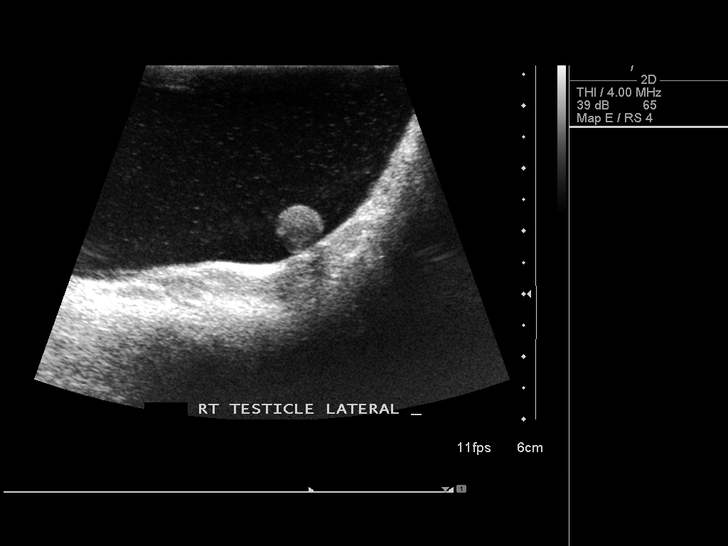
[im 27/72]
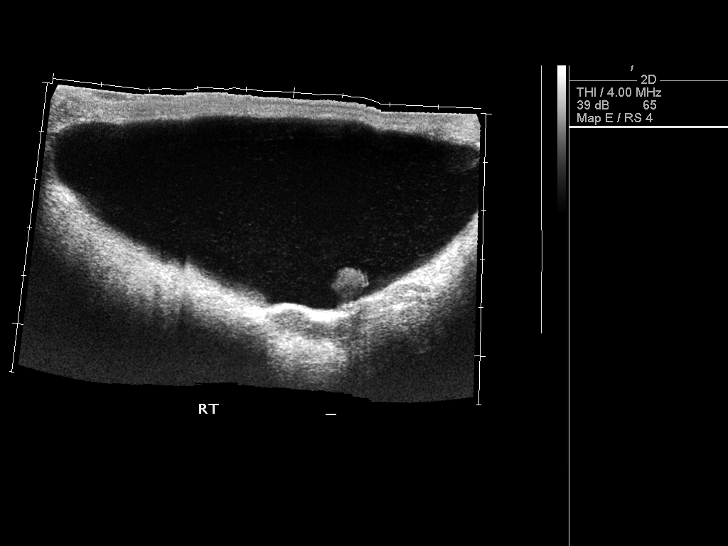
[im 33/72]
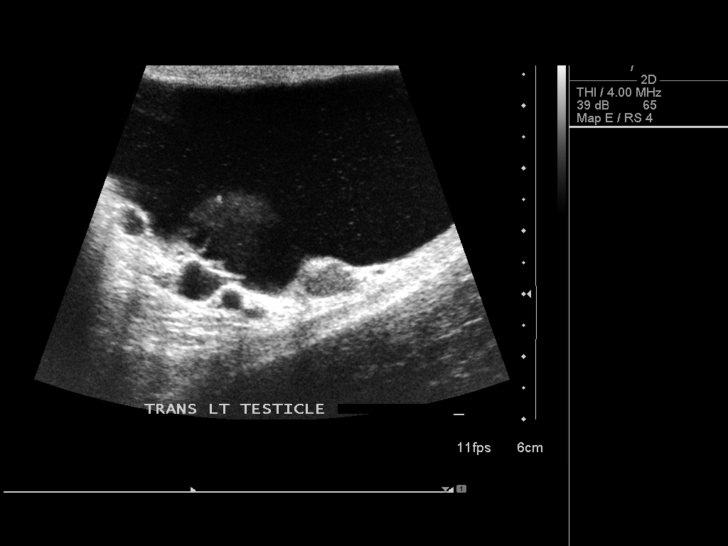
[im 39/72]
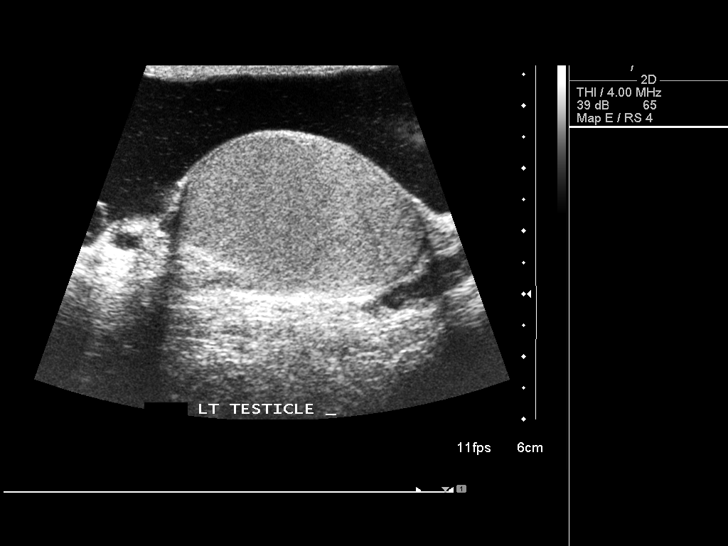
[im 45/72]
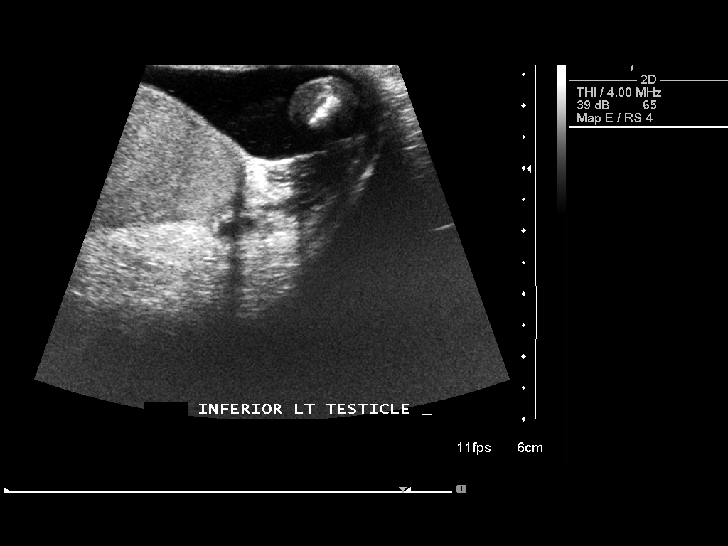
[im 48/72]
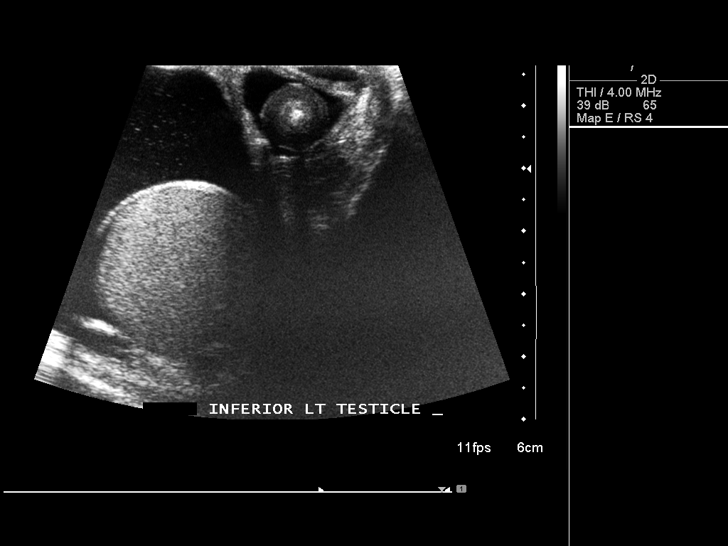
[im 54/72]
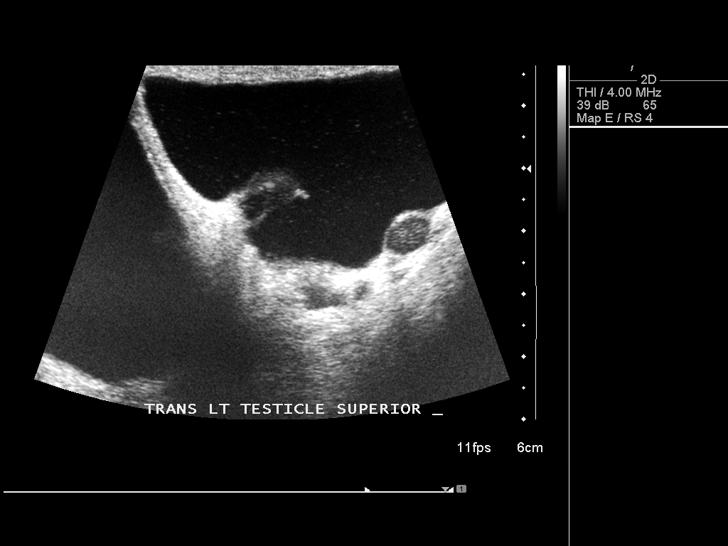
[im 60/72]
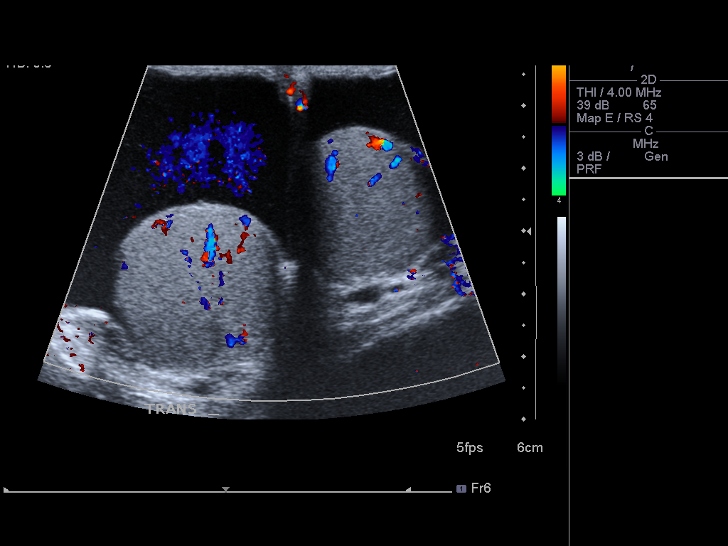
[im 66/72]
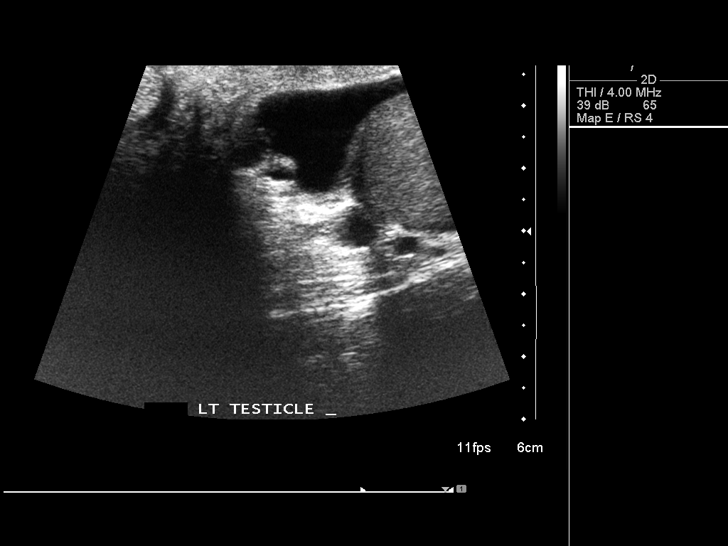
[im 72/72]
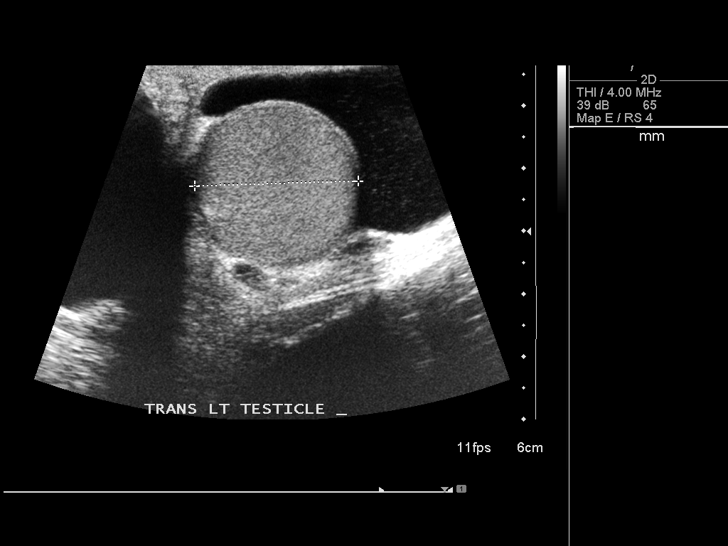

[14 of 25 positions shown; findings below may reference images not displayed]

FINDINGS: Right testicle

Measurements: 4.1 x 2.6 x 2.9 cm. No mass or microlithiasis
visualized.

Left testicle

Measurements: 4.2 x 2.6 x 2.4 cm. No mass or microlithiasis
visualized.

Right epididymis:  Normal in size and appearance.

Left epididymis:  Small cysts are present in the left epididymis.

Hydrocele:  Large bilateral hydrocele

Varicocele:  Negative.

Additional findings: Within the left scrotal space, adjacent to the
testicle and within the hydrocele fluid, there is a 1.2 x 0.9 x
cm mass with calcification. There is minimal vascularity. There is a
similar mass in the right scrotal space measuring 8 mm. There is no
internal vascularity.
IMPRESSION: Large bilateral hydrocele.

There are small extratesticular masses within the hydrocele fluid
bilaterally left greater than right. There are relatively avascular
and of unknown significance. Consider urological consultation for
further evaluation.

## 2016-10-06 ENCOUNTER — Other Ambulatory Visit: Payer: Self-pay | Admitting: Internal Medicine

## 2016-10-11 ENCOUNTER — Encounter: Payer: Self-pay | Admitting: Internal Medicine

## 2016-10-11 ENCOUNTER — Ambulatory Visit (INDEPENDENT_AMBULATORY_CARE_PROVIDER_SITE_OTHER): Payer: BLUE CROSS/BLUE SHIELD | Admitting: Internal Medicine

## 2016-10-11 VITALS — BP 120/72 | HR 68 | Wt 220.0 lb

## 2016-10-11 DIAGNOSIS — E1142 Type 2 diabetes mellitus with diabetic polyneuropathy: Secondary | ICD-10-CM | POA: Diagnosis not present

## 2016-10-11 DIAGNOSIS — IMO0002 Reserved for concepts with insufficient information to code with codable children: Secondary | ICD-10-CM

## 2016-10-11 DIAGNOSIS — E1165 Type 2 diabetes mellitus with hyperglycemia: Secondary | ICD-10-CM | POA: Diagnosis not present

## 2016-10-11 MED ORDER — SEMAGLUTIDE(0.25 OR 0.5MG/DOS) 2 MG/1.5ML ~~LOC~~ SOPN: 0.5000 mg | PEN_INJECTOR | SUBCUTANEOUS | 5 refills | Status: DC

## 2016-10-11 NOTE — Progress Notes (Signed)
Patient ID: Elijah Chen, male   DOB: Apr 11, 1946, 71 y.o.   MRN: [id number]  HPI: Elijah Chen is a 71 y.o.-year-old male, returning for f/u for DM2, dx in 2006, insulin-dependent since dx, uncontrolled, with complications (peripheral neuropathy). He moved here from [place], where he was followed by endocrinology. Last visit 1.5 mo ago.  Last hemoglobin A1c was: Lab Results  Component Value Date   HGBA1C 9.6 08/17/2016   HGBA1C 9.6 03/21/2016   HGBA1C 9.6 10/16/2015  04/2015: HbA1c 13% 2015: Hba1c 7%  Pt is on a regimen of:   - Lantus 45 units after dinner. - Novolog 15 min before meals: - smaller meal: 12 units - regular meal: 14 units  - larger meal: 16 units - Metformin immediately after dinner: 2000 mg - Ozempic 0.5 mg weekly - added 08/2016 He also takes Apple Cider Vinegar 450mg  2 tab 2x a day - for neuropathy.  Pt checks his sugars 2x a day and they are: - am: 136, 167, 175-270, 331 >> 110-175, 190 (if forgets Lantus) >> 250 >> 140s - 2h after b'fast: n/c >> 74-256, 548 if has cereals >> 150s >> 252 >> n/c - before lunch: 160s >> 201-288 >> 120-155 >> 150 >> 115, 140-150 - 2h after lunch: n/c >> 108-298, 533 >> 170s >> 78, 158-287 >> n/c - before dinner: 73-238, 280 >> 160s >> 135-303 >> 130-145 >> >200 >> 150-170 - 2h after dinner: n/c >> 122-235, 280 >> ? >> 160-325 >> 150-175 >> n/c - bedtime: n/c >> 124, 214-322, 553 >> ? >> 78, 94-261, 347 >> n/c >> 80, 115-180 - nighttime: n/c No lows. Lowest sugar was 50 >> 74; in 11/2015, he had a CBG 22 (took insulin and did not eat dinner!!!!). Lowest now 52 x1 at night (skipped dinner) >> 79 >> 59 x1 (took the insulin and forgot to eat). he has hypoglycemia awareness at 70.  Highest sugar was 300-400 >> 500s (when forgets insulin) >> 300s >> 260 >> 180s.  Pt's meals are: - Breakfast: oatmeal or toast + jelly (sugar-free) but weekends: bacon, pancakes - Lunch: sandwich - Dinner: salad + ham.  - Snacks: sodas  - +  CKD: 05/03/2016: 17/1.06 Lab Results  Component Value Date   BUN 24 09/04/2015   CREATININE 1.38 (H) 09/04/2015  05/21/2015: ACR 3.36 - Most recent lipids: 05/03/2016: 175/142/46/100 No results found for: CHOL, HDL, LDLCALC, LDLDIRECT, TRIG, CHOLHDL  On Pravastatin. - last eye exam was: Abstract on 09/30/2015  Component Date Value Ref Range Status  . HM Diabetic Eye Exam 09/16/2015 No Retinopathy  No Retinopathy Final   - no numbness and tingling in his feet. On alpha lipoic acid. Also takes a B12 vitamin.  Last TSH normal - 05/03/2016: 1.4 ROS: Constitutional: no weight gain /loss, no fatigue, no subjective hyperthermia/hypothermia, no nocturia Eyes: no blurry vision, no xerophthalmia ENT: no sore throat, no nodules palpated in throat, no dysphagia/no odynophagia, no hoarseness Cardiovascular: no CP/SOB/palpitations/leg swelling Respiratory: no cough/SOB Gastrointestinal: no N/V/D/C/heartburn Musculoskeletal: no muscle/joint aches Skin: no rashes Neurological: no tremors/numbness/tingling/dizziness  I reviewed pt's medications, allergies, PMH, social hx, family hx, and changes were documented in the history of present illness. Otherwise, unchanged from my initial visit note.  No past medical history on file. - we will need to obtain records from PCP.   No past surgical history on file.   Social History   Social History  . Marital Status: Married    Spouse Name: N/A   Social  History Main Topics  . Smoking status: Never Smoker   . Smokeless tobacco: Not on file  . Alcohol Use: No  . Drug Use: No   Current Outpatient Prescriptions on File Prior to Visit  Medication Sig Dispense Refill  . aspirin 81 MG tablet Take 81 mg by mouth daily.    Marland Kitchen glucose blood (BAYER CONTOUR TEST) test strip 1 each by Other route daily. Use as instructed DX E11.65, Z79.4 100 each 1  . Insulin Pen Needle (PEN NEEDLES 31GX5/16") 31G X 8 MM MISC Use needles 4 times a day as directed. DX  E11.65, Z79.4 100 each 1  . LANTUS SOLOSTAR 100 UNIT/ML Solostar Pen Inject 45 Units into the skin daily after supper. 15 mL 5  . LANTUS SOLOSTAR 100 UNIT/ML Solostar Pen INJECT 40 UNITS INTO THE SKIN DAILY AT 10PM 15 mL 2  . lisinopril-hydrochlorothiazide (PRINZIDE,ZESTORETIC) 20-12.5 MG tablet   0  . metFORMIN (GLUCOPHAGE) 1000 MG tablet TAKE ONE TABLET BY MOUTH TWICE DAILY WITH A MEAL 180 tablet 2  . Misc Natural Products (APPLE CIDER VINEGAR) TABS Take 4 tablets by mouth daily.    Marland Kitchen NOVOLOG FLEXPEN 100 UNIT/ML FlexPen Inject 12-18 Units into the skin 3 (three) times daily with meals. 15 mL 5  . pravastatin (PRAVACHOL) 80 MG tablet   0  . Semaglutide (OZEMPIC) 0.25 or 0.5 MG/DOSE SOPN Inject 0.25 mg into the skin once a week. 1 pen 5   No current facility-administered medications on file prior to visit.     Allergies  Allergen Reactions  . Penicillins Hives   No family history of diabetes.  PE: BP 120/72 (BP Location: Left Arm, Patient Position: Sitting)   Pulse 68   Wt 220 lb (99.8 kg)   SpO2 95%   BMI 32.49 kg/m  Body mass index is 32.49 kg/m. Wt Readings from Last 3 Encounters:  10/11/16 220 lb (99.8 kg)  08/17/16 223 lb (101.2 kg)  05/17/16 223 lb (101.2 kg)   Constitutional: overweight, in NAD Eyes: PERRLA, EOMI, no exophthalmos ENT: moist mucous membranes, no thyromegaly, no cervical lymphadenopathy Cardiovascular: RRR, No MRG Respiratory: CTA B Gastrointestinal: abdomen soft, NT, ND, BS+ Musculoskeletal: no deformities, strength intact in all 4 Skin: moist, warm, no rashes Neurological: no tremor with outstretched hands, DTR normal in all 4  ASSESSMENT: 1. 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 Patient with long-standing, uncontrolled diabetes, on basal-bolus insulin + oral antidiabetic regimen (glipizide + metformin) and now on Ozempic added at last visit. His sugars are markedly better  as he is also working on improving his diet. Will continue current dose of Ozempic. Last HbA1c was 9.6% (stable, high). - I suggested to:  Patient Instructions  Please continue: - Lantus 45 units after dinner. - Novolog 15 min before meals: - smaller meal: 12 units - regular meal: 14 units  - larger meal: 16 units - Metformin immediately after dinner: 2000 mg - Ozempic 0.5 mg weekly  Please return in 1.5 months with your sugar log.   - continue checking sugars at different times of the day - check 3 times a day, rotating checks - advised for yearly eye exams  >> he needs one  - he had labs from PCP >> will need to get the results - Return to clinic in 3 mo with sugar log   2. PN - continue B12 vitamin + Alpha Lipoic Acid  Philemon Kingdom, MD PhD  [facility]  

## 2016-10-11 NOTE — Patient Instructions (Addendum)
Please continue: - Lantus 45 units after dinner. - Novolog 15 min before meals: - smaller meal: 12 units - regular meal: 14 units  - larger meal: 16 units - Metformin immediately after dinner: 2000 mg - Ozempic 0.5 mg weekly  Please return in 2 months with your sugar log.

## 2016-11-01 DIAGNOSIS — I10 Essential (primary) hypertension: Secondary | ICD-10-CM | POA: Diagnosis not present

## 2016-11-01 DIAGNOSIS — E119 Type 2 diabetes mellitus without complications: Secondary | ICD-10-CM | POA: Diagnosis not present

## 2016-11-01 DIAGNOSIS — E7800 Pure hypercholesterolemia, unspecified: Secondary | ICD-10-CM | POA: Diagnosis not present

## 2016-11-01 DIAGNOSIS — Z1389 Encounter for screening for other disorder: Secondary | ICD-10-CM | POA: Diagnosis not present

## 2016-12-13 ENCOUNTER — Encounter: Payer: Self-pay | Admitting: Internal Medicine

## 2016-12-13 ENCOUNTER — Ambulatory Visit (INDEPENDENT_AMBULATORY_CARE_PROVIDER_SITE_OTHER): Payer: BLUE CROSS/BLUE SHIELD | Admitting: Internal Medicine

## 2016-12-13 VITALS — BP 134/72 | HR 71 | Ht 69.0 in | Wt 221.0 lb

## 2016-12-13 DIAGNOSIS — E1165 Type 2 diabetes mellitus with hyperglycemia: Secondary | ICD-10-CM

## 2016-12-13 DIAGNOSIS — E1142 Type 2 diabetes mellitus with diabetic polyneuropathy: Secondary | ICD-10-CM

## 2016-12-13 DIAGNOSIS — IMO0002 Reserved for concepts with insufficient information to code with codable children: Secondary | ICD-10-CM

## 2016-12-13 LAB — POCT GLYCOSYLATED HEMOGLOBIN (HGB A1C): Hemoglobin A1C: 8.9

## 2016-12-13 NOTE — Addendum Note (Signed)
Addended by: Caprice Beaver T on: 12/13/2016 02:08 PM   Modules accepted: Orders

## 2016-12-13 NOTE — Progress Notes (Signed)
Patient ID: Elijah Chen, male   DOB: 1945/11/28, 71 y.o.   MRN: [id number]  HPI: Elijah Chen is a 71 y.o.-year-old male, returning for f/u for DM2, dx in 2006, insulin-dependent since dx, uncontrolled, with complications (peripheral neuropathy). He moved here from [place], where he was followed by endocrinology. Last visit 2 mo ago.  Last hemoglobin A1c was: Lab Results  Component Value Date   HGBA1C 9.6 08/17/2016   HGBA1C 9.6 03/21/2016   HGBA1C 9.6 10/16/2015  04/2015: HbA1c 13% 2015: Hba1c 7%  Pt is on a regimen of:   - Lantus 45 units after dinner - may forget this it 1-2x a week. - Novolog 15 min before meals: - smaller meal: 12 units - regular meal: 14 units  - larger meal: 16 units - Metformin immediately after dinner: 2000 mg - Ozempic 0.5 mg weekly - added 08/2016 He also takes Apple Cider Vinegar 450 mg 2 tab 2x a day - for neuropathy.  Pt checks his sugars 2x a day: - am: 110-175, 190 (if forgets Lantus) >> 250 >> 140s >> 130-175 - 2h after b'fast: n/c >> 74-256, 548 if has cereals >> 150s >> 252 >> n/c - before lunch: 160s >> 201-288 >> 120-155 >> 150 >> 115, 140-150 >> 150-190 - 2h after lunch: n/c >> 108-298, 533 >> 170s >> 78, 158-287 >> n/c - before dinner:160s >> 135-303 >> 130-145 >> >200 >> 150-170 >> 130s - 2h after dinner: n/c >> 122-235, 280 >> ? >> 160-325 >> 150-175 >> n/c - bedtime:  124, 214-322, 553 >> ? >> 78, 94-261, 347 >> n/c >> 80, 115-180 >> 140-150s - nighttime: n/c No lows. Lowest sugar was 51, 61. he has hypoglycemia awareness at 70.  Highest sugar was 300-400 >> 500s (when forgets insulin) >> 300s >> 260 >> 180s >> 190.  Pt's meals are: - Breakfast: oatmeal or toast + jelly (sugar-free) but weekends: bacon, pancakes - Lunch: sandwich - Dinner: salad + ham.  - Snacks: sodas  - he has CKD: 05/03/2016: 17/1.06 Lab Results  Component Value Date   BUN 24 09/04/2015   CREATININE 1.38 (H) 09/04/2015  05/21/2015: ACR 3.36 - Most recent  lipids: 05/03/2016: 175/142/46/100 No results found for: CHOL, HDL, LDLCALC, LDLDIRECT, TRIG, CHOLHDL  On Pravastatin. - Reviewed last eye exam was: Abstract on 09/30/2015  Component Date Value Ref Range Status  . HM Diabetic Eye Exam 09/16/2015 No Retinopathy  No Retinopathy Final   - no numbness and tingling in his feet. On alpha lipoic acid. Also takes a B12 vitamin.  Last TSH normal - 05/03/2016: 1.4  ROS: Constitutional: no weight gain/no weight loss, no fatigue, no subjective hyperthermia, no subjective hypothermia Eyes: no blurry vision, no xerophthalmia ENT: + sore throat, no nodules palpated in throat, no dysphagia, no odynophagia, no hoarseness Cardiovascular: no CP/no SOB/no palpitations/no leg swelling Respiratory: + cough/no SOB/no wheezing Gastrointestinal: no N/no V/no D/no C/no acid reflux Musculoskeletal: no muscle aches/no joint aches Skin: no rashes, no hair loss Neurological: no tremors/no numbness/no tingling/no dizziness  I reviewed pt's medications, allergies, PMH, social hx, family hx, and changes were documented in the history of present illness. Otherwise, unchanged from my initial visit note.   No past medical history on file.  No past surgical history on file.   Social History   Social History  . Marital Status: Married    Spouse Name: N/A   Social History Main Topics  . Smoking status: Never Smoker   . Smokeless tobacco:  Not on file  . Alcohol Use: No  . Drug Use: No   Current Outpatient Prescriptions on File Prior to Visit  Medication Sig Dispense Refill  . aspirin 81 MG tablet Take 81 mg by mouth daily.    Marland Kitchen glucose blood (BAYER CONTOUR TEST) test strip 1 each by Other route daily. Use as instructed DX E11.65, Z79.4 100 each 1  . Insulin Pen Needle (PEN NEEDLES 31GX5/16") 31G X 8 MM MISC Use needles 4 times a day as directed. DX E11.65, Z79.4 100 each 1  . LANTUS SOLOSTAR 100 UNIT/ML Solostar Pen Inject 45 Units into the skin daily after  supper. 15 mL 5  . lisinopril-hydrochlorothiazide (PRINZIDE,ZESTORETIC) 20-12.5 MG tablet   0  . metFORMIN (GLUCOPHAGE) 1000 MG tablet TAKE ONE TABLET BY MOUTH TWICE DAILY WITH A MEAL 180 tablet 2  . Misc Natural Products (APPLE CIDER VINEGAR) TABS Take 4 tablets by mouth daily.    Marland Kitchen NOVOLOG FLEXPEN 100 UNIT/ML FlexPen Inject 12-18 Units into the skin 3 (three) times daily with meals. 15 mL 5  . pravastatin (PRAVACHOL) 80 MG tablet   0  . Semaglutide (OZEMPIC) 0.25 or 0.5 MG/DOSE SOPN Inject 0.5 mg into the skin once a week. 1 pen 5   No current facility-administered medications on file prior to visit.     Allergies  Allergen Reactions  . Penicillins Hives   No family history of diabetes.  PE: BP 134/72 (BP Location: Left Arm, Patient Position: Sitting)   Pulse 71   Ht 5\' 9"  (1.753 m)   Wt 221 lb (100.2 kg)   SpO2 95%   BMI 32.64 kg/m  Body mass index is 32.64 kg/m. Wt Readings from Last 3 Encounters:  12/13/16 221 lb (100.2 kg)  10/11/16 220 lb (99.8 kg)  08/17/16 223 lb (101.2 kg)   Constitutional: overweight, in NAD Eyes: PERRLA, EOMI, no exophthalmos ENT: moist mucous membranes, no thyromegaly, no cervical lymphadenopathy Cardiovascular: RRR,  + 1/6 SEM, noRG Respiratory: CTA B Gastrointestinal: abdomen soft, NT, ND, BS+ Musculoskeletal: no deformities, strength intact in all 4 Skin: moist, warm, no rashes Neurological: no tremor with outstretched hands, DTR normal in all 4   ASSESSMENT: 1. 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 Patient with long-standing, uncontrolled diabetes, on basal-bolus insulin + oral antidiabetic regimen (glipizide + metformin) and now Ozempic >> sugars are higher in am but better in the pm. He forgets the Lantus >> will move it before dinner to take along with Novolog. He may drop his sugars if he works in the yard >> will advice to take less Novolog if he plans to be  active after the meal.  - we also discussed about change in diet. Last HbA1c was 9.6% (stable, high). - I suggested to:  Patient Instructions  Please continue: - Lantus 45 units but move it before dinner - Novolog 15 min before meals: - smaller meal: 12 units - regular meal: 14 units  - larger meal: 16 units If you plan to be active after a meal, reduce the insulin with that meal by 2-4 units. - Metformin immediately after dinner: 2000 mg - Ozempic 0.5 mg weekly  Please return in 3 months with your sugar log.  Read the following book: Dr. Alyssa Grove - Program for Reversing Diabetes  - today, HbA1c is 8.7% (better) - continue checking sugars at different times of the day - check 3x a day, rotating checks -  advised for yearly eye exams >> needs a new one - Return to clinic in 3 mo with sugar log   2. PN - continue B12 vitamin + Alpha Lipoic Acid  Philemon Kingdom, MD PhD [facility] Endocrin[facility]

## 2016-12-13 NOTE — Patient Instructions (Addendum)
Please continue: - Lantus 45 units but move it before dinner - Novolog 15 min before meals: - smaller meal: 12 units - regular meal: 14 units  - larger meal: 16 units If you plan to be active after a meal, reduce the insulin with that meal by 2-4 units. - Metformin immediately after dinner: 2000 mg - Ozempic 0.5 mg weekly  Please return in 3 months with your sugar log.  Read the following book: Dr. Alyssa Grove - Program for Reversing Diabetes

## 2017-03-21 ENCOUNTER — Ambulatory Visit: Payer: BLUE CROSS/BLUE SHIELD | Admitting: Internal Medicine

## 2017-05-02 ENCOUNTER — Other Ambulatory Visit: Payer: Self-pay | Admitting: Internal Medicine

## 2017-05-06 DIAGNOSIS — G458 Other transient cerebral ischemic attacks and related syndromes: Secondary | ICD-10-CM | POA: Diagnosis not present

## 2017-05-11 ENCOUNTER — Encounter: Payer: Self-pay | Admitting: Internal Medicine

## 2017-05-11 DIAGNOSIS — E119 Type 2 diabetes mellitus without complications: Secondary | ICD-10-CM | POA: Diagnosis not present

## 2017-05-11 DIAGNOSIS — I10 Essential (primary) hypertension: Secondary | ICD-10-CM | POA: Diagnosis not present

## 2017-05-11 DIAGNOSIS — Z125 Encounter for screening for malignant neoplasm of prostate: Secondary | ICD-10-CM | POA: Diagnosis not present

## 2017-05-11 DIAGNOSIS — R413 Other amnesia: Secondary | ICD-10-CM | POA: Diagnosis not present

## 2017-05-11 DIAGNOSIS — Z0000 Encounter for general adult medical examination without abnormal findings: Secondary | ICD-10-CM | POA: Diagnosis not present

## 2017-05-11 DIAGNOSIS — Z23 Encounter for immunization: Secondary | ICD-10-CM | POA: Diagnosis not present

## 2017-05-11 DIAGNOSIS — E7800 Pure hypercholesterolemia, unspecified: Secondary | ICD-10-CM | POA: Diagnosis not present

## 2017-05-15 ENCOUNTER — Encounter: Payer: Self-pay | Admitting: Internal Medicine

## 2017-05-15 ENCOUNTER — Ambulatory Visit: Payer: BLUE CROSS/BLUE SHIELD | Admitting: Internal Medicine

## 2017-05-15 VITALS — BP 144/72 | HR 60 | Temp 97.8°F | Wt 211.2 lb

## 2017-05-15 DIAGNOSIS — E1165 Type 2 diabetes mellitus with hyperglycemia: Secondary | ICD-10-CM

## 2017-05-15 DIAGNOSIS — E1142 Type 2 diabetes mellitus with diabetic polyneuropathy: Secondary | ICD-10-CM | POA: Diagnosis not present

## 2017-05-15 DIAGNOSIS — R011 Cardiac murmur, unspecified: Secondary | ICD-10-CM | POA: Diagnosis not present

## 2017-05-15 DIAGNOSIS — IMO0002 Reserved for concepts with insufficient information to code with codable children: Secondary | ICD-10-CM

## 2017-05-15 LAB — POCT GLYCOSYLATED HEMOGLOBIN (HGB A1C): Hemoglobin A1C: 9.5

## 2017-05-15 MED ORDER — SEMAGLUTIDE(0.25 OR 0.5MG/DOS) 2 MG/1.5ML ~~LOC~~ SOPN: 0.5000 mg | PEN_INJECTOR | SUBCUTANEOUS | 11 refills | Status: DC

## 2017-05-15 MED ORDER — NOVOLOG FLEXPEN 100 UNIT/ML ~~LOC~~ SOPN: 16.0000 [IU] | PEN_INJECTOR | Freq: Three times a day (TID) | SUBCUTANEOUS | 11 refills | Status: DC

## 2017-05-15 MED ORDER — "PEN NEEDLES 5/16"" 31G X 8 MM MISC": 11 refills | Status: AC

## 2017-05-15 MED ORDER — LANTUS SOLOSTAR 100 UNIT/ML ~~LOC~~ SOPN: 45.0000 [IU] | PEN_INJECTOR | Freq: Every day | SUBCUTANEOUS | 5 refills | Status: DC

## 2017-05-15 NOTE — Patient Instructions (Addendum)
Please continue: - Lantus 45 units in the evening - Metformin immediately after dinner: 2000 mg  Please increase: - Novolog 15 min before meals: - smaller meal: 16 units - regular meal: 18 units  - larger meal: 20 units If you plan to be active after a meal, reduce the insulin with that meal by 2-4 units.  Restart: - Ozempic 0.25 mg weekly x2 weeks, the increase to 0.5 mg weekly.  Please return in 3 months with your sugar log.

## 2017-05-15 NOTE — Progress Notes (Addendum)
Patient ID: Elijah Chen, male   DOB: [birth year], 71 y.o.   MRN: [id number]  HPI: Elijah Chen is a 71 y.o.-year-old male, returning for f/u for DM2, dx in 2006, insulin-dependent since dx, uncontrolled, with complications (peripheral neuropathy). He moved here from [place], where he was followed by endocrinology. Last visit 5 mo ago.  He had more stress (brother died) and he also ate too much.   Last hemoglobin A1c was: Lab Results  Component Value Date   HGBA1C 8.9 12/13/2016   HGBA1C 9.6 08/17/2016   HGBA1C 9.6 03/21/2016  04/2015: HbA1c 13% 2015: Hba1c 7%  Pt is on a regimen of:   - Lantus 45 units at dinner  - Novolog 15 min before meals - but skipping doses - smaller meal: 12 units - regular meal: 14 units  - larger meal: 16 units - Metformin immediately after dinner: 2000 mg - Ozempic 0.5 mg weekly - added 08/2016 >> stopped after our last visit as he ran out of the med He also takes Control and instrumentation engineer Vinegar 450 mg 2 tab 2x a day -for neuropathy  Pt checks his sugars 1x a day - 1 mo ave: 250! - am: 250 >> 140s >> 130-175 >> 188-250 - 2h after b'fast:  150s >> 252 >> n/c - before lunch: 1150 >> 115, 140-150 >> 150-190 >> n/c - 2h after lunch: 78, 158-287 >> n/c - before dinner: >200 >> 150-170 >> 130s >> n/c - 2h after dinner:160-325 >> 150-175 >> n/c - bedtime:   80, 115-180 >> 140-150s >> n/c - nighttime: n/c Lowest sugar was 51, 61 >> 55 (skipped a meal). he has hypoglycemia awareness at 70.  Highest sugar was 190 >> 350.  Pt's meals are: - Breakfast: oatmeal or toast + jelly (sugar-free) but weekends: bacon, pancakes >> skips now - Lunch: sandwich - Dinner: salad + ham.  - Snacks: sweets No sodas.  He apparently had labs by PCP recently, but I do not have the records.  Will request them from PCP.  - + CKD: 05/03/2016: 17/1.06 Lab Results  Component Value Date   BUN 24 09/04/2015   CREATININE 1.38 (H) 09/04/2015  05/21/2015: ACR 3.36 - Most recent  lipids: 05/03/2016: 175/142/46/100 No results found for: CHOL, HDL, LDLCALC, LDLDIRECT, TRIG, CHOLHDL  On Pravastatin. - Reviewed last eye exam - he is due: Abstract on 09/30/2015  Component Date Value Ref Range Status  . HM Diabetic Eye Exam 09/16/2015 No Retinopathy  No Retinopathy Final   - + improved numbness and tingling in his feet. On alpha lipoic acid + B12 vitamin  Last TSH normal - 05/03/2016: 1.4 No results found for: TSH  ROS: Constitutional: no weight gain/no weight loss, no fatigue, no subjective hyperthermia, no subjective hypothermia Eyes: no blurry vision, no xerophthalmia ENT: no sore throat, no nodules palpated in throat, no dysphagia, no odynophagia, no hoarseness Cardiovascular: no CP/no SOB/no palpitations/no leg swelling Respiratory: no cough/no SOB/no wheezing Gastrointestinal: no N/no V/no D/no C/no acid reflux Musculoskeletal: no muscle aches/no joint aches Skin: no rashes, no hair loss Neurological: no tremors/no numbness/no tingling/no dizziness  I reviewed pt's medications, allergies, PMH, social hx, family hx, and changes were documented in the history of present illness. Otherwise, unchanged from my initial visit note.  Patient Active Problem List   Diagnosis Date Noted  . Peripheral sensory neuropathy due to type 2 diabetes mellitus ([place]) 11/30/2015    Priority: High  . Uncontrolled type 2 diabetes mellitus with peripheral neuropathy ([place]) 09/04/2015  Priority: High    No past surgical history on file.   Social History   Social History  . Marital Status: Married    Spouse Name: N/A   Social History Main Topics  . Smoking status: Never Smoker   . Smokeless tobacco: Not on file  . Alcohol Use: No  . Drug Use: No   Current Outpatient Medications on File Prior to Visit  Medication Sig Dispense Refill  . aspirin 81 MG tablet Take 81 mg by mouth daily.    Marland Kitchen glucose blood (BAYER CONTOUR TEST) test strip 1 each by Other route daily. Use as  instructed DX E11.65, Z79.4 100 each 1  . Insulin Pen Needle (PEN NEEDLES 31GX5/16") 31G X 8 MM MISC Use needles 4 times a day as directed. DX E11.65, Z79.4 100 each 1  . LANTUS SOLOSTAR 100 UNIT/ML Solostar Pen Inject 45 Units into the skin daily after supper. 15 mL 5  . lisinopril-hydrochlorothiazide (PRINZIDE,ZESTORETIC) 20-12.5 MG tablet   0  . metFORMIN (GLUCOPHAGE) 1000 MG tablet TAKE 1 TABLET BY MOUTH TWICE DAILY WITH A MEAL 180 tablet 2  . Misc Natural Products (APPLE CIDER VINEGAR) TABS Take 4 tablets by mouth daily.    Marland Kitchen NOVOLOG FLEXPEN 100 UNIT/ML FlexPen Inject 12-18 Units into the skin 3 (three) times daily with meals. 15 mL 5  . pravastatin (PRAVACHOL) 80 MG tablet   0  . Semaglutide (OZEMPIC) 0.25 or 0.5 MG/DOSE SOPN Inject 0.5 mg into the skin once a week. 1 pen 5   No current facility-administered medications on file prior to visit.     Allergies  Allergen Reactions  . Penicillins Hives   No family history of diabetes.  PE: BP (!) 144/72   Pulse 60   Temp 97.8 F (36.6 C) (Oral)   Wt 211 lb 4 oz (95.8 kg)   SpO2 97%   BMI 31.20 kg/m  Body mass index is 31.2 kg/m. Wt Readings from Last 3 Encounters:  05/15/17 211 lb 4 oz (95.8 kg)  12/13/16 221 lb (100.2 kg)  10/11/16 220 lb (99.8 kg)   Constitutional: overweight, in NAD Eyes: PERRLA, EOMI, no exophthalmos ENT: moist mucous membranes, no thyromegaly, no cervical lymphadenopathy Cardiovascular: RRR, No RG, + 1/6 SEM Respiratory: CTA B Gastrointestinal: abdomen soft, NT, ND, BS+ Musculoskeletal: no deformities, strength intact in all 4 Skin: moist, warm, no rashes Neurological: no tremor with outstretched hands, DTR normal in all 4   ASSESSMENT: 1. 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 Patient with long-standing, uncontrolled diabetes, on basal-bolus insulin + oral antidiabetic regimen (metformin) and previously on Ozempic with  improving blood sugars, however, since last visit, he ran out of Ozempic and did not refill it, he also had some more stress with his brothers this from an MI, and also he had multiple dietary indulgences.  There is a consequence, his sugars are much higher than last time.  He is also not using the higher doses of NovoLog and he is skipping meals especially breakfast, therefore, his diabetes control is much worse now. - We discussed about restarting Ozempic and also increasing his NovoLog.  I emphasized the need to control his diet and not to skip insulin doses. - I suggested to:  Patient Instructions  Please continue: - Lantus 45 units in the evening - Metformin immediately after dinner: 2000 mg  Please increase: - Novolog 15 min before meals: - smaller meal: 16 units -  regular meal: 18 units  - larger meal: 20 units If you plan to be active after a meal, reduce the insulin with that meal by 2-4 units.  Restart: - Ozempic 0.25 mg weekly x2 weeks, the increase to 0.5 mg weekly.  Please return in 3 months with your sugar log.  - today, HbA1c is 9.5% (higher) -Start checking sugars at different times of the day - check 3x a day, rotating checks - advised for yearly eye exams >> he needs one - He is up-to-date with flu shot - Return to clinic in 3 mo with sugar log   2. PN - stable - continue B12 vitamin + Alpha Lipoic Acid  Addendum: Labs from PCP - drawn 05/11/2017: Glu 186, 15/0.92, GFR 81 Lipids: 150/132/39/85 TSH 1.64 B12 432 HbA1c 9.8%  Philemon Kingdom, MD PhD [facility] Endocrinology

## 2017-05-15 NOTE — Addendum Note (Signed)
Addended by: Modena Nunnery on: 05/15/2017 02:20 PM   Modules accepted: Orders

## 2017-05-16 ENCOUNTER — Other Ambulatory Visit: Payer: Self-pay | Admitting: Family Medicine

## 2017-05-16 DIAGNOSIS — R0989 Other specified symptoms and signs involving the circulatory and respiratory systems: Secondary | ICD-10-CM

## 2017-05-18 ENCOUNTER — Other Ambulatory Visit: Payer: BLUE CROSS/BLUE SHIELD

## 2017-05-18 DIAGNOSIS — Z1211 Encounter for screening for malignant neoplasm of colon: Secondary | ICD-10-CM | POA: Diagnosis not present

## 2017-05-18 DIAGNOSIS — Z01818 Encounter for other preprocedural examination: Secondary | ICD-10-CM | POA: Diagnosis not present

## 2017-05-22 ENCOUNTER — Other Ambulatory Visit: Payer: BLUE CROSS/BLUE SHIELD

## 2017-05-25 DIAGNOSIS — I350 Nonrheumatic aortic (valve) stenosis: Secondary | ICD-10-CM | POA: Diagnosis not present

## 2017-05-25 DIAGNOSIS — E7849 Other hyperlipidemia: Secondary | ICD-10-CM | POA: Diagnosis not present

## 2017-05-25 DIAGNOSIS — R011 Cardiac murmur, unspecified: Secondary | ICD-10-CM | POA: Diagnosis not present

## 2017-05-25 DIAGNOSIS — I10 Essential (primary) hypertension: Secondary | ICD-10-CM | POA: Diagnosis not present

## 2017-05-25 DIAGNOSIS — R9431 Abnormal electrocardiogram [ECG] [EKG]: Secondary | ICD-10-CM | POA: Diagnosis not present

## 2017-07-07 ENCOUNTER — Other Ambulatory Visit: Payer: Self-pay | Admitting: Internal Medicine

## 2017-08-15 ENCOUNTER — Encounter: Payer: Self-pay | Admitting: Internal Medicine

## 2017-08-15 ENCOUNTER — Ambulatory Visit: Payer: BLUE CROSS/BLUE SHIELD | Admitting: Internal Medicine

## 2017-08-15 ENCOUNTER — Ambulatory Visit: Payer: BLUE CROSS/BLUE SHIELD | Admitting: Endocrinology

## 2017-08-15 VITALS — BP 144/72 | HR 62 | Ht 69.0 in | Wt 215.0 lb

## 2017-08-15 DIAGNOSIS — E1165 Type 2 diabetes mellitus with hyperglycemia: Secondary | ICD-10-CM

## 2017-08-15 DIAGNOSIS — E6609 Other obesity due to excess calories: Secondary | ICD-10-CM

## 2017-08-15 DIAGNOSIS — Z6831 Body mass index (BMI) 31.0-31.9, adult: Secondary | ICD-10-CM

## 2017-08-15 DIAGNOSIS — E1142 Type 2 diabetes mellitus with diabetic polyneuropathy: Secondary | ICD-10-CM

## 2017-08-15 DIAGNOSIS — IMO0002 Reserved for concepts with insufficient information to code with codable children: Secondary | ICD-10-CM

## 2017-08-15 LAB — POCT GLYCOSYLATED HEMOGLOBIN (HGB A1C): Hemoglobin A1C: 8 % — AB (ref 4.0–5.6)

## 2017-08-15 NOTE — Patient Instructions (Addendum)
Please change: - Lantus 45 units in the evening - Metformin immediately after dinner: 2000 mg - Novolog 15 min before meals: 12 units before B and L  10 units before D - Ozempic 0.5 mg weekly.  Please return in 3 months with your sugar log.

## 2017-08-15 NOTE — Progress Notes (Signed)
Patient ID: Elijah Chen, male   DOB: 1945/09/08, 72 y.o.   MRN: [id number]  HPI: Elijah Chen is a 72 y.o.-year-old male, returning for f/u for DM2, dx in 2006, insulin-dependent since dx, uncontrolled, with complications (peripheral neuropathy). He moved here from [place], where he was followed by endocrinology. Last visit 3 mo ago.  Last hemoglobin A1c was: Lab Results  Component Value Date   HGBA1C 9.5 05/15/2017   HGBA1C 8.9 12/13/2016   HGBA1C 9.6 08/17/2016  05/11/2017: HbA1c 9.8% 04/2015: HbA1c 13% 2015: Hba1c 7%  Pt is on: - Lantus 45 units in the evening - Metformin immediately after dinner: 2000 mg - Novolog 15 min before meals: he actually takes 12 units... (unclear why?)  If you plan to be active after a meal, reduce the insulin with that meal by 2-4 units. - Ozempic 0.5 mg weekly.- restarted 05/2017 He also takes Apple Cider Vinegar 450 mg 2 tab 2x a day - for neuropathy  Pt checks his sugars 1-3x a day: - am: 250 >> 140s >> 130-175 >> 188-250 >> 88-180, 190, 224 - 2h after b'fast:  150s >> 252 >> n/c - before lunch: 1150 >> 115, 140-150 >> 150-190 >> n/c >> 94-164 - 2h after lunch: 78, 158-287 >> n/c >> 92, 137 - before dinner: >200 >> 150-170 >> 130s >> n/c >>  82, 89-182 - 2h after dinner:160-325 >> 150-175 >> n/c >> 53, 54, 64, 67, 113-185 - bedtime:   80, 115-180 >> 140-150s >> n/c >> 70, 126-165 - nighttime: n/c Lowest sugar was 51, 61 >> 55 (skipped a meal) >> 53. He has hypoglycemia awareness in the 70s.  Highest sugar was 190 >> 350 >> 224.  Pt's meals are: - Breakfast: oatmeal or toast + jelly (sugar-free) but weekends: bacon, pancakes  - Lunch: sandwich - Dinner: salad + ham.  - Snacks: sweets No sodas.  - + CKD. 05/11/2017: Glu 186, 15/0.92, GFR 81 05/03/2016: 17/1.06 Lab Results  Component Value Date   BUN 24 09/04/2015   CREATININE 1.38 (H) 09/04/2015  05/21/2015: ACR 3.36  - + HL: last Lipid panel: 05/11/2017: 150/132/39/85 05/03/2016:  175/142/46/100 No results found for: CHOL, HDL, LDLCALC, LDLDIRECT, TRIG, CHOLHDL  On Pravastatin.  - last eye exam - 09/2015: No DR.  - + improved numbness and tingling in his feet. On alpha lipoic acid + B12 vitamin + Apple Cider Vinegar  Other labs from PCP - drawn 05/11/2017: TSH 1.64 B12 432  ROS: Constitutional: no weight gain/no weight loss, no fatigue, no subjective hyperthermia, no subjective hypothermia Eyes: no blurry vision, no xerophthalmia ENT: no sore throat, no nodules palpated in throat, no dysphagia, no odynophagia, no hoarseness Cardiovascular: no CP/no SOB/no palpitations/no leg swelling Respiratory: no cough/no SOB/no wheezing Gastrointestinal: no N/no V/no D/no C/no acid reflux Musculoskeletal: no muscle aches/no joint aches Skin: no rashes, no hair loss Neurological: no tremors/+ occasional numbness + tingling/no dizziness  I reviewed pt's medications, allergies, PMH, social hx, family hx, and changes were documented in the history of present illness. Otherwise, unchanged from my initial visit note.  Patient Active Problem List   Diagnosis Date Noted  . Peripheral sensory neuropathy due to type 2 diabetes mellitus ([place]) 11/30/2015    Priority: High  . Uncontrolled type 2 diabetes mellitus with peripheral neuropathy ([place]) 09/04/2015    Priority: High    Social History   Social History  . Marital Status: Married    Spouse Name: N/A   Social History Main Topics  .  Smoking status: Never Smoker   . Smokeless tobacco: Not on file  . Alcohol Use: No  . Drug Use: No   Current Outpatient Medications on File Prior to Visit  Medication Sig Dispense Refill  . aspirin 81 MG tablet Take 81 mg by mouth daily.    Marland Kitchen glucose blood (BAYER CONTOUR TEST) test strip 1 each by Other route daily. Use as instructed DX E11.65, Z79.4 100 each 1  . Insulin Pen Needle (PEN NEEDLES 31GX5/16") 31G X 8 MM MISC Use needles 4 times a day as directed. DX E11.65, Z79.4 200 each  11  . LANTUS SOLOSTAR 100 UNIT/ML Solostar Pen Inject 45 Units daily after supper into the skin. 15 mL 5  . LANTUS SOLOSTAR 100 UNIT/ML Solostar Pen Inject 45 units into the skin at bedtime 15 pen 2  . lisinopril-hydrochlorothiazide (PRINZIDE,ZESTORETIC) 20-12.5 MG tablet   0  . metFORMIN (GLUCOPHAGE) 1000 MG tablet TAKE 1 TABLET BY MOUTH TWICE DAILY WITH A MEAL 180 tablet 2  . Misc Natural Products (APPLE CIDER VINEGAR) TABS Take 4 tablets by mouth daily.    Marland Kitchen NOVOLOG FLEXPEN 100 UNIT/ML FlexPen Inject 16-20 Units 3 (three) times daily with meals into the skin. 15 mL 11  . pravastatin (PRAVACHOL) 80 MG tablet   0  . Semaglutide (OZEMPIC) 0.25 or 0.5 MG/DOSE SOPN Inject 0.5 mg once a week into the skin. 2 pen 11   No current facility-administered medications on file prior to visit.     Allergies  Allergen Reactions  . Penicillins Hives   No family history of diabetes.  PE: BP (!) 144/72   Pulse 62   Ht 5\' 9"  (1.753 m)   Wt 215 lb (97.5 kg)   SpO2 94%   BMI 31.75 kg/m  Body mass index is 31.75 kg/m. Wt Readings from Last 3 Encounters:  08/15/17 215 lb (97.5 kg)  05/15/17 211 lb 4 oz (95.8 kg)  12/13/16 221 lb (100.2 kg)   Constitutional: overweight, in NAD Eyes: PERRLA, EOMI, no exophthalmos ENT: moist mucous membranes, no thyromegaly, no cervical lymphadenopathy Cardiovascular: RRR, No RG, + 1/6 SEM Respiratory: CTA B Gastrointestinal: abdomen soft, NT, ND, BS+ Musculoskeletal: no deformities, strength intact in all 4 Skin: moist, warm, no rashes Neurological: no tremor with outstretched hands, DTR normal in all 4   ASSESSMENT: 1. 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  Obesity class 1  PLAN:  1. Patient with long-standing, uncontrolled diabetes, on basal-bolus insulin + oral antidiabetic regimen (metformin) and previously on Ozempic with improving blood sugars, however, since last visit, he ran out  of Ozempic and did not refill it, he also had some more stress with his brothers this from an MI, and also he had multiple dietary indulgences.  There is a consequence, his sugars are much higher than last time.  He is also not using the higher doses of NovoLog and he is skipping meals especially breakfast, therefore, his diabetes control is much worse now. - We discussed about restarting Ozempic and also increasing his NovoLog.  I emphasized the need to control his diet and not to skip insulin doses. - He is currently taking his Ozempic consistently but he is only taking 12 units of NovoLog with meals, more than the recommended doses.  His sugars are fairly well-controlled during the day, but they are low after dinner.  I advised him to reduce the insulin with this meal. - I suggested to:  Patient Instructions  Please change: - Lantus 45 units in the evening - Metformin immediately after dinner: 2000 mg - Novolog 15 min before meals: 12 units before B and L  10 units before D - Ozempic 0.5 mg weekly.  Please return in 3 months with your sugar log.   - today, HbA1c is 8.0% (improved) - continue checking sugars at different times of the day - check 3x a day, rotating checks - advised for yearly eye exams >> he is you - Return to clinic in 3 mo with sugar log   2. PN - stable, occasional numbness and tingling, but not too bothersome.  Mostly on waking up. - Continues on B12 alpha lipoic acid apple cider vinegar  3.  Obesity class 1 - Reviewed together his weight and BMI. - His wife is pushing him to eat more, as she feels that he is not eating enough.  I reassured the patient that he is eating enough, since he continues to be obese.  He did lose weight in the last few years, and I encouraged him to continue until his BMI gets to the normal range. - Continue Ozempic which also helps  Elijah Kingdom, MD PhD [facility] Endocrinology

## 2017-08-31 DIAGNOSIS — R319 Hematuria, unspecified: Secondary | ICD-10-CM | POA: Diagnosis not present

## 2017-08-31 DIAGNOSIS — N433 Hydrocele, unspecified: Secondary | ICD-10-CM | POA: Diagnosis not present

## 2017-08-31 DIAGNOSIS — R8271 Bacteriuria: Secondary | ICD-10-CM | POA: Diagnosis not present

## 2017-08-31 DIAGNOSIS — N430 Encysted hydrocele: Secondary | ICD-10-CM | POA: Diagnosis not present

## 2017-08-31 DIAGNOSIS — N471 Phimosis: Secondary | ICD-10-CM | POA: Diagnosis not present

## 2017-09-06 ENCOUNTER — Encounter (HOSPITAL_BASED_OUTPATIENT_CLINIC_OR_DEPARTMENT_OTHER): Payer: Self-pay | Admitting: *Deleted

## 2017-09-06 ENCOUNTER — Other Ambulatory Visit: Payer: Self-pay | Admitting: Urology

## 2017-09-06 ENCOUNTER — Other Ambulatory Visit: Payer: Self-pay

## 2017-09-06 NOTE — Progress Notes (Signed)
SPOKE W/ PT VIA PHONE FOR PRE-OP INTERVIEW.  NPO AFTER MN.  ARRIVE AT 0530.  NEEDS ISTAT.  REQUESTED CURRENT EKG , LOV NOTE, AND ANY CARDIAC TESTING TO BE FAXED FROM DR Wynonia Lawman.   RECEIVED INFO REQUESTED FROM DR Wynonia Lawman VIA FAX AND PLACED ON CHART.  [facility]pic HEALTH HISTORY UPDATED.

## 2017-09-07 NOTE — H&P (Signed)
HPI: Elijah Chen is a 72 year-old male with phimosis, a palpable mass under his foreskin and bilateral hydroceles.  He first noticed his hydrocele 18 months ago. His hydrocele is on both sides. He does not have pain on the side of his hydrocele. His hydrocele does not cause restriction of normal activities.   He has not had injuries to the testicles or scrotum. He has not had scrotal surgery. He has not had a testicular infection.   01/13/16: Elijah Chen is a 72 yo male who is sent in consultation by Dr. Earle Gell for bilateral hydroceles with undetermined extratesticular masses within the hydroceles on recent US in May. He had a CT in January that showed no GU findings.   08/31/17: He reports that he continues to have swelling of his scrotum on both sides. It occasionally is uncomfortable and gets in the way.     CC: I have trouble rolling my foreskin back.  HPI: He has had difficulties with rolling his foreskin back for 1 week. He was last able to easily roll his foreskin back approximately 08/21/2017. His symptoms have gotten worse over the last year. He does have diabetes.   He does not have dysuria. He has not had inflammation of the head of his penis. He has had to use creams on the lesion.   This condition would be considered of mild to moderate severity with no modifying factors or associated signs or symptoms other than as noted above.   08/31/17: He was noted by Dr. Jeffie Pollock to have moderate phimosis but that his foreskin was retractable when he was evaluated in 7/17.  He has been sent emergently from his primary care physician's office for inability to retract his foreskin.  The patient reports that about 1 and half weeks ago he retracted his foreskin in the shower and found difficult to replace it. He noted an area that he described as brown about the size of a dime. He was not able to describe it further as to its location. Since he has replaced his foreskin he has not been able to retract  the foreskin. He is not having any pain when he urinates but he says there is some slight discomfort to palpation in the area of the firm area beneath his foreskin. He also said he has seen some blood at the initiation of his urination on several occasions.     ALLERGIES: Penicillin    MEDICATIONS: Aspirin  Metformin Hcl 1,000 mg tablet  Lantus 100 unit/ml vial  Lisinopril-Hydrochlorothiazide     GU PSH: Vasectomy - about 1994      PSH Notes: intestinal surgery     NON-GU PSH: None   GU PMH: Hydrocele, He has bilateral hydroceles with lesions in the sac bilaterally that are most likely benign. - 01/13/2016 Phimosis, Moderate without symptoms. - 01/13/2016      PMH Notes: surgery on intestines     NON-GU PMH: Cardiac murmur, unspecified, He has an asymptomatic systolic ejection murmur. - 01/13/2016 Diabetes Type 2 Encounter for general adult medical examination without abnormal findings, Encounter for preventive health examination GERD    FAMILY HISTORY: Heart problem - Father, Mother   SOCIAL HISTORY: Marital Status: Married Preferred Language: English; Ethnicity: Not Hispanic Or Latino; Race: White Current Smoking Status: Patient has never smoked.  Drinks 4+ caffeinated drinks per day. Patient's occupation is/was retired.     Notes: 1 son   REVIEW OF SYSTEMS:    GU Review Male:   Patient  denies frequent urination, hard to postpone urination, burning/ pain with urination, get up at night to urinate, leakage of urine, stream starts and stops, trouble starting your stream, have to strain to urinate , erection problems, and penile pain.  Gastrointestinal (Upper):   Patient denies nausea, vomiting, and indigestion/ heartburn.  Gastrointestinal (Lower):   Patient denies diarrhea and constipation.  Constitutional:   Patient denies fever, night sweats, weight loss, and fatigue.  Skin:   Patient denies skin rash/ lesion and itching.  Eyes:   Patient denies blurred vision and double  vision.  Ears/ Nose/ Throat:   Patient denies sore throat and sinus problems.  Hematologic/Lymphatic:   Patient denies easy bruising and swollen glands.  Cardiovascular:   Patient denies leg swelling and chest pains.  Respiratory:   Patient denies cough and shortness of breath.  Endocrine:   Patient denies excessive thirst.  Musculoskeletal:   Patient denies back pain and joint pain.  Neurological:   Patient denies headaches and dizziness.  Psychologic:   Patient denies depression and anxiety.   VITAL SIGNS:    Weight 207 lb / 93.89 kg  Height 71 in / 180.34 cm  BP 116/67 mmHg  Pulse 60 /min  BMI 28.9 kg/m   GU PHYSICAL EXAMINATION:    Scrotum: No lesions. No edema. No cysts. No warts.  Epididymides: not palpable.   Testes: 3-5 cm hydrocele left testis. 5+ cm hydrocele right testis. The testicles and epididymii are not palpable.  Urethral Meatus: Normal size. No lesion, no wart, no discharge, no polyp. Normal location.  Penis: He is uncircumcised. Beneath the foreskin is a firm lesion that feels like it is attached to the foreskin as opposed to the glans but it is difficult to assess. It is slightly tender. It measures approximately 3 x 4 cm. He has severe phimosis and the foreskin cannot be retracted. He did have some slightly purulent/bloody fluid expressed from beneath the foreskin.   MULTI-SYSTEM PHYSICAL EXAMINATION:    Constitutional: Well-nourished. No physical deformities. Normally developed. Good grooming.  Neck: Neck symmetrical, not swollen. Normal tracheal position.  Respiratory: No labored breathing, no use of accessory muscles.   Cardiovascular: Heart murmur. SEM 3/6. Normal temperature, normal extremity pulses, no swelling, no varicosities.   Skin: No paleness, no jaundice, no cyanosis. No lesion, no ulcer, no rash.  Neurologic / Psychiatric: Oriented to time, oriented to place, oriented to person. No depression, no anxiety, no agitation.  Gastrointestinal: large  Umbilical hernia. No mass, no tenderness, no rigidity, non obese abdomen.   Musculoskeletal: Normal gait and station of head and neck.     PAST DATA REVIEWED:  Source Of History:  Patient  Lab Test Review:   BUN/Creatinine  Records Review:   Previous Patient Records, POC Tool  Notes:                     His PSA in 11/18 was normal at 0.21. At that time his creatinine was normal at 0.92.   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 There may be some infection beneath the foreskin saw culture his urine and I am empirically starting him on Bactrim. My concern is that there is a firm area which may be inflammatory but we discussed neoplastic concerns. Because of that I have recommended we proceed with a circumcision and possible biopsy of any lesion identified. I have discussed the procedure with him in detail including the incision used, the risks and complications, the probability of success, the outpatient nature of the procedure as well as the anticipated postoperative course. He understands and has elected to proceed.  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 They are symptomatic. They were not as symptomatic when he was seen last but he said that he would like to have these treated simultaneously with the treatment of his phimosis and therefore I have discussed this operation with him  as well in detail including its risks and complications.  3 NON-GU:   Bacteriuria - R82.71 He did have some bacteriuria and therefore his urine will be cultured and I have started him on empiric Bactrim.          Notes:   He has fairly large bilateral hydroceles. We did discuss the placement of a Penrose drain postoperatively.   When I saw him in the preop holding area he indicated he had experienced an episode of total, gross hematuria without dysuria.  Because of this I have discussed with him proceeding with evaluation of his lower urinary tract with cystoscopy.

## 2017-09-08 ENCOUNTER — Encounter (HOSPITAL_BASED_OUTPATIENT_CLINIC_OR_DEPARTMENT_OTHER): Payer: Self-pay | Admitting: *Deleted

## 2017-09-08 ENCOUNTER — Ambulatory Visit (HOSPITAL_BASED_OUTPATIENT_CLINIC_OR_DEPARTMENT_OTHER): Payer: BLUE CROSS/BLUE SHIELD | Admitting: Anesthesiology

## 2017-09-08 ENCOUNTER — Encounter (HOSPITAL_BASED_OUTPATIENT_CLINIC_OR_DEPARTMENT_OTHER)
Admission: RE | Disposition: A | Discharge status: Home / Self Care | Payer: Self-pay | Source: Ambulatory Visit | Attending: Urology

## 2017-09-08 ENCOUNTER — Other Ambulatory Visit: Payer: Self-pay

## 2017-09-08 ENCOUNTER — Ambulatory Visit (HOSPITAL_BASED_OUTPATIENT_CLINIC_OR_DEPARTMENT_OTHER)
Admission: RE | Admit: 2017-09-08 | Discharge: 2017-09-08 | Disposition: A | Discharge status: Home / Self Care | Payer: BLUE CROSS/BLUE SHIELD | Source: Ambulatory Visit | Attending: Urology | Admitting: Urology

## 2017-09-08 DIAGNOSIS — I350 Nonrheumatic aortic (valve) stenosis: Secondary | ICD-10-CM | POA: Diagnosis not present

## 2017-09-08 DIAGNOSIS — E119 Type 2 diabetes mellitus without complications: Secondary | ICD-10-CM | POA: Insufficient documentation

## 2017-09-08 DIAGNOSIS — R011 Cardiac murmur, unspecified: Secondary | ICD-10-CM | POA: Diagnosis not present

## 2017-09-08 DIAGNOSIS — N471 Phimosis: Secondary | ICD-10-CM | POA: Diagnosis not present

## 2017-09-08 DIAGNOSIS — C601 Malignant neoplasm of glans penis: Secondary | ICD-10-CM | POA: Insufficient documentation

## 2017-09-08 DIAGNOSIS — Z7982 Long term (current) use of aspirin: Secondary | ICD-10-CM | POA: Insufficient documentation

## 2017-09-08 DIAGNOSIS — Q211 Atrial septal defect: Secondary | ICD-10-CM | POA: Diagnosis not present

## 2017-09-08 DIAGNOSIS — N430 Encysted hydrocele: Secondary | ICD-10-CM | POA: Diagnosis not present

## 2017-09-08 DIAGNOSIS — Z880 Allergy status to penicillin: Secondary | ICD-10-CM | POA: Insufficient documentation

## 2017-09-08 DIAGNOSIS — R229 Localized swelling, mass and lump, unspecified: Secondary | ICD-10-CM | POA: Diagnosis not present

## 2017-09-08 DIAGNOSIS — I10 Essential (primary) hypertension: Secondary | ICD-10-CM | POA: Insufficient documentation

## 2017-09-08 DIAGNOSIS — N4889 Other specified disorders of penis: Secondary | ICD-10-CM | POA: Diagnosis not present

## 2017-09-08 DIAGNOSIS — N433 Hydrocele, unspecified: Secondary | ICD-10-CM | POA: Insufficient documentation

## 2017-09-08 DIAGNOSIS — R310 Gross hematuria: Secondary | ICD-10-CM | POA: Diagnosis not present

## 2017-09-08 DIAGNOSIS — C609 Malignant neoplasm of penis, unspecified: Secondary | ICD-10-CM | POA: Diagnosis not present

## 2017-09-08 DIAGNOSIS — Z794 Long term (current) use of insulin: Secondary | ICD-10-CM | POA: Diagnosis not present

## 2017-09-08 HISTORY — DX: Nocturia: R35.1

## 2017-09-08 HISTORY — DX: Disorder of penis, unspecified: N48.9

## 2017-09-08 HISTORY — DX: Atrial septal defect: Q21.1

## 2017-09-08 HISTORY — DX: Presence of spectacles and contact lenses: Z97.3

## 2017-09-08 HISTORY — DX: Incisional hernia with obstruction, without gangrene: K43.0

## 2017-09-08 HISTORY — DX: Long term (current) use of insulin: E11.9

## 2017-09-08 HISTORY — DX: Essential (primary) hypertension: I10

## 2017-09-08 HISTORY — DX: Nonrheumatic aortic (valve) stenosis: I35.0

## 2017-09-08 HISTORY — DX: Phimosis: N47.1

## 2017-09-08 HISTORY — PX: CYSTOSCOPY: SHX5120

## 2017-09-08 HISTORY — DX: Hydrocele, unspecified: N43.3

## 2017-09-08 HISTORY — DX: Urgency of urination: R39.15

## 2017-09-08 HISTORY — DX: Long term (current) use of insulin: Z79.4

## 2017-09-08 HISTORY — DX: Cardiac murmur, unspecified: R01.1

## 2017-09-08 HISTORY — PX: HYDROCELE EXCISION: SHX482

## 2017-09-08 HISTORY — PX: CIRCUMCISION: SHX1350

## 2017-09-08 HISTORY — DX: Patent foramen ovale: Q21.12

## 2017-09-08 HISTORY — DX: Hyperlipidemia, unspecified: E78.5

## 2017-09-08 LAB — POCT I-STAT, CHEM 8
BUN: 16 mg/dL (ref 6–20)
Calcium, Ion: 1.33 mmol/L (ref 1.15–1.40)
Chloride: 102 mmol/L (ref 101–111)
Creatinine, Ser: 1 mg/dL (ref 0.61–1.24)
Glucose, Bld: 170 mg/dL — ABNORMAL HIGH (ref 65–99)
HCT: 36 % — ABNORMAL LOW (ref 39.0–52.0)
Hemoglobin: 12.2 g/dL — ABNORMAL LOW (ref 13.0–17.0)
Potassium: 4.1 mmol/L (ref 3.5–5.1)
Sodium: 141 mmol/L (ref 135–145)
TCO2: 25 mmol/L (ref 22–32)

## 2017-09-08 LAB — GLUCOSE, CAPILLARY: Glucose-Capillary: 166 mg/dL — ABNORMAL HIGH (ref 65–99)

## 2017-09-08 SURGERY — CIRCUMCISION, ADULT
Anesthesia: General

## 2017-09-08 MED ORDER — LIDOCAINE 2% (20 MG/ML) 5 ML SYRINGE
INTRAMUSCULAR | Status: DC | PRN
  Administered 2017-09-08: 80 mg via INTRAVENOUS

## 2017-09-08 MED ORDER — DEXAMETHASONE SODIUM PHOSPHATE 10 MG/ML IJ SOLN
INTRAMUSCULAR | Status: AC
  Filled 2017-09-08: qty 1

## 2017-09-08 MED ORDER — BUPIVACAINE HCL 0.5 % IJ SOLN
INTRAMUSCULAR | Status: DC | PRN
Start: 2017-09-08 — End: 2017-09-08
  Administered 2017-09-08: 20 mL

## 2017-09-08 MED ORDER — LIDOCAINE 2% (20 MG/ML) 5 ML SYRINGE
INTRAMUSCULAR | Status: AC
  Filled 2017-09-08: qty 5

## 2017-09-08 MED ORDER — OXYCODONE HCL 10 MG PO TABS: 10.0000 mg | ORAL_TABLET | ORAL | 0 refills | Status: DC | PRN

## 2017-09-08 MED ORDER — BACITRACIN-NEOMYCIN-POLYMYXIN 400-5-5000 EX OINT
TOPICAL_OINTMENT | CUTANEOUS | Status: DC | PRN
  Administered 2017-09-08: 1 via TOPICAL

## 2017-09-08 MED ORDER — METOCLOPRAMIDE HCL 5 MG/ML IJ SOLN
10.0000 mg | Freq: Once | INTRAMUSCULAR | Status: DC | PRN
  Filled 2017-09-08: qty 2

## 2017-09-08 MED ORDER — ARTIFICIAL TEARS OPHTHALMIC OINT
TOPICAL_OINTMENT | OPHTHALMIC | Status: AC
  Filled 2017-09-08: qty 3.5

## 2017-09-08 MED ORDER — PROPOFOL 10 MG/ML IV BOLUS
INTRAVENOUS | Status: DC | PRN
  Administered 2017-09-08: 150 mg via INTRAVENOUS
  Administered 2017-09-08: 50 mg via INTRAVENOUS

## 2017-09-08 MED ORDER — DEXAMETHASONE SODIUM PHOSPHATE 10 MG/ML IJ SOLN
INTRAMUSCULAR | Status: DC | PRN
  Administered 2017-09-08: 10 mg via INTRAVENOUS

## 2017-09-08 MED ORDER — CEFAZOLIN SODIUM-DEXTROSE 1-4 GM/50ML-% IV SOLN
INTRAVENOUS | Status: AC
  Filled 2017-09-08: qty 100

## 2017-09-08 MED ORDER — CEFAZOLIN SODIUM-DEXTROSE 2-4 GM/100ML-% IV SOLN
2.0000 g | Freq: Once | INTRAVENOUS | Status: AC
  Administered 2017-09-08: 2 g via INTRAVENOUS
  Filled 2017-09-08: qty 100

## 2017-09-08 MED ORDER — OXYCODONE HCL 5 MG PO TABS
5.0000 mg | ORAL_TABLET | Freq: Once | ORAL | Status: AC
  Administered 2017-09-08: 5 mg via ORAL
  Filled 2017-09-08: qty 1

## 2017-09-08 MED ORDER — OXYCODONE HCL 5 MG PO TABS
ORAL_TABLET | ORAL | Status: AC
  Filled 2017-09-08: qty 1

## 2017-09-08 MED ORDER — ONDANSETRON HCL 4 MG/2ML IJ SOLN
INTRAMUSCULAR | Status: AC
  Filled 2017-09-08: qty 2

## 2017-09-08 MED ORDER — FENTANYL CITRATE (PF) 100 MCG/2ML IJ SOLN
INTRAMUSCULAR | Status: AC
  Filled 2017-09-08: qty 2

## 2017-09-08 MED ORDER — LACTATED RINGERS IV SOLN
INTRAVENOUS | Status: DC
  Administered 2017-09-08 (×2): via INTRAVENOUS
  Filled 2017-09-08: qty 1000

## 2017-09-08 MED ORDER — ONDANSETRON HCL 4 MG/2ML IJ SOLN
INTRAMUSCULAR | Status: DC | PRN
  Administered 2017-09-08: 4 mg via INTRAVENOUS

## 2017-09-08 MED ORDER — PROPOFOL 10 MG/ML IV BOLUS
INTRAVENOUS | Status: AC
  Filled 2017-09-08: qty 40

## 2017-09-08 MED ORDER — FENTANYL CITRATE (PF) 100 MCG/2ML IJ SOLN
25.0000 ug | INTRAMUSCULAR | Status: DC | PRN
  Filled 2017-09-08: qty 1

## 2017-09-08 MED ORDER — FENTANYL CITRATE (PF) 100 MCG/2ML IJ SOLN
INTRAMUSCULAR | Status: DC | PRN
  Administered 2017-09-08 (×2): 50 ug via INTRAVENOUS
  Administered 2017-09-08: 25 ug via INTRAVENOUS
  Administered 2017-09-08: 50 ug via INTRAVENOUS
  Administered 2017-09-08: 25 ug via INTRAVENOUS

## 2017-09-08 MED ORDER — MEPERIDINE HCL 25 MG/ML IJ SOLN
6.2500 mg | INTRAMUSCULAR | Status: DC | PRN
  Filled 2017-09-08: qty 1

## 2017-09-08 SURGICAL SUPPLY — 49 items
BANDAGE CO FLEX L/F 1IN X 5YD (GAUZE/BANDAGES/DRESSINGS) IMPLANT
BANDAGE CO FLEX L/F 2IN X 5YD (GAUZE/BANDAGES/DRESSINGS) IMPLANT
BLADE CLIPPER SURG (BLADE) IMPLANT
BLADE SURG 15 STRL LF DISP TIS (BLADE) ×3 IMPLANT
BLADE SURG 15 STRL SS (BLADE) ×1
BNDG GAUZE ELAST 4 BULKY (GAUZE/BANDAGES/DRESSINGS) ×4 IMPLANT
CANISTER SUCT 3000ML PPV (MISCELLANEOUS) ×4 IMPLANT
CANISTER SUCTION 1200CC (MISCELLANEOUS) IMPLANT
CLEANER CAUTERY TIP 5X5 PAD (MISCELLANEOUS) ×3 IMPLANT
COVER BACK TABLE 60X90IN (DRAPES) ×4 IMPLANT
COVER MAYO STAND STRL (DRAPES) ×4 IMPLANT
DISSECTOR ROUND CHERRY 3/8 STR (MISCELLANEOUS) IMPLANT
DRAIN PENROSE 18X1/4 LTX STRL (WOUND CARE) ×4 IMPLANT
DRAPE LAPAROTOMY 100X72 PEDS (DRAPES) ×4 IMPLANT
ELECT REM PT RETURN 9FT ADLT (ELECTROSURGICAL) ×4
ELECTRODE REM PT RTRN 9FT ADLT (ELECTROSURGICAL) ×3 IMPLANT
GAUZE SPONGE 4X4 12PLY STRL (GAUZE/BANDAGES/DRESSINGS) ×4 IMPLANT
GAUZE SPONGE 4X4 16PLY XRAY LF (GAUZE/BANDAGES/DRESSINGS) ×8 IMPLANT
GLOVE BIO SURGEON STRL SZ8 (GLOVE) ×4 IMPLANT
GOWN STRL REUS W/ TWL LRG LVL3 (GOWN DISPOSABLE) ×3 IMPLANT
GOWN STRL REUS W/ TWL XL LVL3 (GOWN DISPOSABLE) ×3 IMPLANT
GOWN STRL REUS W/TWL LRG LVL3 (GOWN DISPOSABLE) ×1
GOWN STRL REUS W/TWL XL LVL3 (GOWN DISPOSABLE) ×1
IV NS IRRIG 3000ML ARTHROMATIC (IV SOLUTION) IMPLANT
KIT TURNOVER CYSTO (KITS) ×4 IMPLANT
MANIFOLD NEPTUNE II (INSTRUMENTS) IMPLANT
NEEDLE HYPO 22GX1.5 SAFETY (NEEDLE) ×4 IMPLANT
NEEDLE HYPO 25X1 1.5 SAFETY (NEEDLE) IMPLANT
NS IRRIG 500ML POUR BTL (IV SOLUTION) ×4 IMPLANT
PACK BASIN DAY SURGERY FS (CUSTOM PROCEDURE TRAY) ×4 IMPLANT
PAD CLEANER CAUTERY TIP 5X5 (MISCELLANEOUS) ×1
PENCIL BUTTON HOLSTER BLD 10FT (ELECTRODE) ×4 IMPLANT
SET IRRIG Y TYPE TUR BLADDER L (SET/KITS/TRAYS/PACK) ×4 IMPLANT
SUPPORT SCROTAL LG STRP (MISCELLANEOUS) ×4 IMPLANT
SUT CHROMIC 3 0 SH 27 (SUTURE) ×16 IMPLANT
SUT CHROMIC 4 0 RB 1X27 (SUTURE) IMPLANT
SUT CHROMIC 5 0 RB 1 27 (SUTURE) IMPLANT
SUT ETHILON 3 0 PS 1 (SUTURE) ×4 IMPLANT
SUT SILK 4 0 TIES 17X18 (SUTURE) IMPLANT
SUT VIC AB 4-0 RB1 27 (SUTURE)
SUT VIC AB 4-0 RB1 27X BRD (SUTURE) IMPLANT
SUT VICRYL 6 0 RB 1 (SUTURE) IMPLANT
SYR CONTROL 10ML LL (SYRINGE) ×4 IMPLANT
TOWEL OR 17X24 6PK STRL BLUE (TOWEL DISPOSABLE) ×8 IMPLANT
TRAY DSU PREP LF (CUSTOM PROCEDURE TRAY) ×4 IMPLANT
TUBE CONNECTING 12X1/4 (SUCTIONS) ×4 IMPLANT
WATER STERILE IRR 3000ML UROMA (IV SOLUTION) ×4 IMPLANT
WATER STERILE IRR 500ML POUR (IV SOLUTION) IMPLANT
YANKAUER SUCT BULB TIP NO VENT (SUCTIONS) ×4 IMPLANT

## 2017-09-08 NOTE — Op Note (Addendum)
PATIENT:  Elijah Chen  PRE-OPERATIVE DIAGNOSIS: 1.  Phimosis. 2.  Penile mass. 3.  Bilateral hydroceles  POST-OPERATIVE DIAGNOSIS:  Same  PROCEDURE:  Procedure(s): 1.  Circumcision 2.  Biopsy of glans lesion. 3. Bilateral hydrocelectomy  SURGEON:  Claybon Jabs  INDICATION: Mr. Steiner is a 72 year old male who has diabetes and reported that he began having difficulty retracting his foreskin when I saw him in the office on 08/31/17 and that this had begun approximately 1 week previously.  He did not report any dysuria.  He also has had bilateral hydroceles with what was described as "undetermined extratesticular masses within the hydroceles on ultrasound.  On exam I found a firm area beneath the foreskin and some mildly purulent appearing material but a urine culture was found to be negative.  He reported to me in the preoperative holding area that he recently had a single episode of gross hematuria.  ANESTHESIA:  General  EBL:  Minimal  DRAINS: Quarter-inch Penrose drains in the dependent portion of his right and left hemiscrotum.  LOCAL MEDICATIONS USED:  half percent Marcaine  SPECIMEN: 1.  Foreskin. 2.  Biopsy of glans lesion.  DISPOSITION OF SPECIMEN: Pathology  Description of procedure: After informed consent the patient was brought to the major OR, placed on the table and administered general anesthesia. His genitalia was then sterilely prepped and draped. An official timeout was then performed.  I initially performed a dorsal penile block in standard fashion using half percent Marcaine.  I was unable to retract the foreskin under anesthesia and therefore placed a straight hemostat through the fibrotic foreskin and clamped this in the midline at the 12 o'clock position.  I then cut through this clamped area with Metzenbaum scissors and exposed the glans by retracting the foreskin.  There was a foul smelling lesion that measured 3 cm x 3 cm involving the glans on the left-hand  side dorsally.  I marked the location of the corona with a marking pen after replacing the foreskin in the normal anatomic position and then made a circumcising incision approximately 4 mm from the corona.  A second circumcising incision was performed to excise the foreskin completely.  Because the foreskin did have some areas of nodularity near the area where I had identified to the glans lesion I sent this for pathologic evaluation.  I obtained a wedge biopsy from the glans lesion.  I tried to reapproximate the edges with chromic but the lesion was too friable to hold the stitch.  I therefore cauterized the location.  The wedge biopsy was sent for permanent pathologic evaluation.  All bleeding points were cauterized and then I reapproximated the skin edges by placing a U stitch using 3-0 chromic at the 6 o'clock position and a second 3-0 chromic was placed at the 12 o'clock position.  I then closed the skin edges in a running fashion with those 3-0 chromic.  I then turned my attention to the scrotum and the bilateral hydroceles.  A midline median raphae scrotal incision was then made and carried down over the right hydrocele. The tissue over the hydrocele was cleared using a combination of sharp and blunt technique. The hydrocele was then opened, drained of clear amber fluid and delivered through the incision.  The lesion that was identified on ultrasound was found to be a scrotal pearl.  The excess hydrocele sac tissue was excised with the Bovie electrocautery and then I cauterized the edges. I then reapproximated the edges posteriorly behind  the epididymis with a running, locking 3-0 chromic suture. The appendix testis was removed with the Bovie.   His testicle was then replaced in the normal anatomic position and his right hemiscrotum.  Attention was then directed to the left hemiscrotum and a left hydrocelectomy was performed in an identical fashion as described above.  On this side the lesion that  was identified on ultrasound appeared to also be a scrotal pearl.  Once both testicles had been replaced in their normal anatomic position I used a right angle clamp passed through the incision and then cut over the right angle clamp at the dependent aspect of the scrotum on both right and left sides and passed a Penrose drain through the skin incision and into the right and left hemiscrotum.  These were  secured to the skin with a 3-0 nylon suture.   I then closed the deep scrotal tissue with running 3-0 chromic suture in a locking fashion. I injected half percent Marcaine in the subcutaneous tissue and closed the skin with running 3-0 chromic.   I then proceeded with a cystoscopy due to the report of hematuria and passed the 81 French flexible cystoscope down the urethra under direct vision.  I noted no lesions and specifically I noted no evidence of urethral involvement in the area of the glans and distal urethral region.  The rest of the urethra was also noted to be normal.  The prostatic urethra was noted to be free of any lesions and the bladder was then entered.  I noted 1+ trabeculation.  The bladder was fully and systematically inspected with no tumors, stones or inflammatory lesions identified.  Both ureteral orifices were noted to be of normal configuration and position.  The cystoscope was then removed.  Neosporin, a sterile gauze dressing, fluff Kerlix and a scrotal support were applied.  I also wrapped the glans and distal penile shaft loosely with 4 x 4's and a Kerlix.  The patient tolerated the procedure well no intraoperative complications. Needle sponge and instrument counts were correct at the end of the operation.   Once drapes had been removed I examined the patient's groins for adenopathy.  I found no palpable adenopathy on the right-hand side.  On the left there were at least 2 palpable lymph nodes that were approximately a centimeter in size.  They were soft and not fixed.  PLAN OF  CARE: Discharge to home after PACU  PATIENT DISPOSITION:  PACU - hemodynamically stable.

## 2017-09-08 NOTE — Anesthesia Postprocedure Evaluation (Signed)
Anesthesia Post Note  Patient: Elijah Chen  Procedure(s) Performed: CIRCUMCISION ADULT, PENILE BIOPSY (N/A ) HYDROCELECTOMY ADULT (Bilateral ) CYSTOSCOPY FLEXIBLE     Patient location during evaluation: PACU Anesthesia Type: General Level of consciousness: awake and alert Pain management: pain level controlled Vital Signs Assessment: post-procedure vital signs reviewed and stable Respiratory status: spontaneous breathing, nonlabored ventilation, respiratory function stable and patient connected to nasal cannula oxygen Cardiovascular status: blood pressure returned to baseline and stable Postop Assessment: no apparent nausea or vomiting Anesthetic complications: no    Last Vitals:  Vitals:   09/08/17 0945 09/08/17 1125  BP: (!) 130/54 (!) 145/56  Pulse: 66 66  Resp: 18 16  Temp:  36.7 C  SpO2: 94% 98%    Last Pain:  Vitals:   09/08/17 1125  TempSrc:   PainSc: 3                  Montez Hageman

## 2017-09-08 NOTE — Discharge Instructions (Signed)
Postoperative instructions for circumcision  Wound:  In most cases your incision will have absorbable sutures that run along the course of your incision and will dissolve within the first 10-20 days. Some will fall out even earlier. Expect some redness as the sutures dissolved but this should occur only around the sutures. If there is generalized redness, especially with increasing pain or swelling, let us know. The penis will very likely get "black and blue" as the blood in the tissues spread. Sometimes the whole penis will turn colors. The black and blue is followed by a yellow and brown color. In time, all the discoloration will go away.  Diet:  You may return to your normal diet within 24 hours following your surgery. You may note some mild nausea and possibly vomiting the first 6-8 hours following surgery. This is usually due to the side effects of anesthesia, and will disappear quite soon. I would suggest clear liquids and a very light meal the first evening following your surgery.  Activity:  Your physical activity should be restricted the first 48 hours. During that time you should remain relatively inactive, moving about only when necessary. During the first 7-10 days following surgery he should avoid lifting any heavy objects (anything greater than 15 pounds), and avoid strenuous exercise. If you work, ask [place] specifically about your restrictions, both for work and home. We will write a note to your employer if needed.  Ice packs can be placed on and off over the penis for the first 48 hours to help relieve the pain and keep the swelling down. Frozen peas or corn in a ZipLock bag can be frozen, used and re-frozen. Fifteen minutes on and 15 minutes off is a reasonable schedule.   Hygiene:  You may shower 48 hours after your surgery. Tub bathing should  be restricted until the seventh day.  Medication:  You will be sent home with some type of pain medication. In many cases you will be sent home with a narcotic pain pill (Vicodin or Tylox). If the pain is not too bad, you may take either Tylenol (acetaminophen) or Advil (ibuprofen) which contain no narcotic agents, and might be tolerated a little better, with fewer side effects. If the pain medication you are sent home with does not control the pain, you will have to let us know. Some narcotic pain medications cannot be given or refilled by a phone call to a pharmacy.  Problems you should report to [place]:   Fever of 101.0 degrees Fahrenheit or greater.  Moderate or severe swelling under the skin incision or involving the scrotum.  Drug reaction such as hives, a rash, nausea or vomiting.  Scrotal surgery postoperative instructions  Wound:  In most cases your incision will have absorbable sutures that will dissolve within the first 10-20 days. Some will fall out even earlier. Expect some redness as the sutures dissolved but this should occur only around the sutures. If there is generalized redness, especially with increasing pain or swelling, let us know. The scrotum will very likely get "black and blue" as the blood in the tissues spread. Sometimes the whole scrotum will turn colors. The black and blue is followed by a yellow and brown color. In time, all the discoloration will go away. In some cases some firm swelling in the area of the testicle may persist for up to 4-6 weeks after the surgery and is considered normal in most cases.  Diet:  You may return to your normal diet  within 24 hours following your surgery. You may note some mild nausea and possibly vomiting the first 6-8 hours following surgery. This is usually due to the side effects of anesthesia, and will disappear quite soon. I would suggest clear liquids and a very light meal the first evening following your  surgery.  Activity:  Your physical activity should be restricted the first 48 hours. During that time you should remain relatively inactive, moving about only when necessary. During the first 7-10 days following surgery he should avoid lifting any heavy objects (anything greater than 15 pounds), and avoid strenuous exercise. If you work, ask [place] specifically about your restrictions, both for work and home. We will write a note to your employer if needed.  You should plan to wear a tight pair of jockey shorts or an athletic supporter for the first 4-5 days, even to sleep. This will keep the scrotum immobilized to some degree and keep the swelling down.  Ice packs should be placed on and off over the scrotum for the first 48 hours. Frozen peas or corn in a ZipLock bag can be frozen, used and re-frozen. Fifteen minutes on and 15 minutes off is a reasonable schedule. The ice is a good pain reliever and keeps the swelling down.  Hygiene:  You may shower 48 hours after your surgery. Tub bathing should be restricted until the seventh day.  Medication:  You will be sent home with some type of pain medication. In many cases you will be sent home with a narcotic pain pill (hydrococone or oxycodone). If the pain is not too bad, you may take either Tylenol (acetaminophen) or Advil (ibuprofen) which contain no narcotic agents, and might be tolerated a little better, with fewer side effects. If the pain medication you are sent home with does not control the pain, you will have to let us know. Some narcotic pain medications cannot be given or refilled by a phone call to a pharmacy.  Problems you should report to [place]:   Fever of 101.0 degrees Fahrenheit or greater.  Moderate or severe swelling under the skin incision or involving the scrotum.  Drug reaction such as hives, a rash, nausea or vomiting.

## 2017-09-08 NOTE — Transfer of Care (Signed)
Immediate Anesthesia Transfer of Care Note  Patient: Elijah Chen  Procedure(s) Performed: CIRCUMCISION ADULT, PENILE BIOPSY (N/A ) HYDROCELECTOMY ADULT (Bilateral ) CYSTOSCOPY FLEXIBLE  Patient Location: PACU  Anesthesia Type:General  Level of Consciousness: awake, alert , oriented and patient cooperative  Airway & Oxygen Therapy: Patient Spontanous Breathing and Patient connected to nasal cannula oxygen  Post-op Assessment: Report given to RN and Post -op Vital signs reviewed and stable  Post vital signs: Reviewed and stable  Last Vitals:  Vitals:   09/08/17 0539  BP: (!) 161/67  Pulse: 69  Resp: 16  Temp: 37 C  SpO2: 96%    Last Pain:  Vitals:   09/08/17 0539  TempSrc: Oral      Patients Stated Pain Goal: 7 (83/81/84 0375)  Complications: No apparent anesthesia complications

## 2017-09-08 NOTE — Anesthesia Procedure Notes (Signed)
Procedure Name: LMA Insertion Date/Time: 09/08/2017 7:33 AM Performed by: Wanita Chamberlain, CRNA Pre-anesthesia Checklist: Patient identified, Emergency Drugs available, Suction available, Patient being monitored and Timeout performed Patient Re-evaluated:Patient Re-evaluated prior to induction Oxygen Delivery Method: Circle system utilized Preoxygenation: Pre-oxygenation with 100% oxygen Induction Type: IV induction Ventilation: Mask ventilation without difficulty LMA: LMA inserted LMA Size: 4.0 Number of attempts: 1 Placement Confirmation: breath sounds checked- equal and bilateral,  CO2 detector and positive ETCO2 Tube secured with: Tape Dental Injury: Teeth and Oropharynx as per pre-operative assessment

## 2017-09-08 NOTE — Anesthesia Preprocedure Evaluation (Addendum)
Anesthesia Evaluation  Patient identified by MRN, date of birth, ID band Patient awake    Reviewed: Allergy & Precautions, NPO status , Patient's Chart, lab work & pertinent test results  Airway Mallampati: II  TM Distance: >3 FB Neck ROM: Full    Dental no notable dental hx. (+) Teeth Intact, Dental Advisory Given,    Pulmonary neg pulmonary ROS,    Pulmonary exam normal breath sounds clear to auscultation       Cardiovascular hypertension, Pt. on medications Normal cardiovascular exam+ Valvular Problems/Murmurs (mild AS with small PFO) AS  Rhythm:Regular Rate:Normal     Neuro/Psych negative neurological ROS  negative psych ROS   GI/Hepatic negative GI ROS, Neg liver ROS,   Endo/Other  diabetes, Type 2, Insulin Dependent  Renal/GU negative Renal ROS   Penile mass w/ drainage Bilateral hydroceles negative genitourinary   Musculoskeletal negative musculoskeletal ROS (+)   Abdominal   Peds negative pediatric ROS (+)  Hematology negative hematology ROS (+)   Anesthesia Other Findings   Reproductive/Obstetrics negative OB ROS                           Anesthesia Physical Anesthesia Plan  ASA: III  Anesthesia Plan: General   Post-op Pain Management:    Induction: Intravenous  PONV Risk Score and Plan: 2 and Ondansetron and Treatment may vary due to age or medical condition  Airway Management Planned: LMA  Additional Equipment:   Intra-op Plan:   Post-operative Plan:   Informed Consent: I have reviewed the patients History and Physical, chart, labs and discussed the procedure including the risks, benefits and alternatives for the proposed anesthesia with the patient or authorized representative who has indicated his/her understanding and acceptance.   Dental advisory given  Plan Discussed with: CRNA  Anesthesia Plan Comments:         Anesthesia Quick Evaluation

## 2017-09-11 ENCOUNTER — Encounter (HOSPITAL_BASED_OUTPATIENT_CLINIC_OR_DEPARTMENT_OTHER): Payer: Self-pay | Admitting: Urology

## 2017-09-25 DIAGNOSIS — C601 Malignant neoplasm of glans penis: Secondary | ICD-10-CM | POA: Diagnosis not present

## 2017-10-05 DIAGNOSIS — R918 Other nonspecific abnormal finding of lung field: Secondary | ICD-10-CM | POA: Diagnosis not present

## 2017-10-05 DIAGNOSIS — K449 Diaphragmatic hernia without obstruction or gangrene: Secondary | ICD-10-CM | POA: Diagnosis not present

## 2017-10-05 DIAGNOSIS — C601 Malignant neoplasm of glans penis: Secondary | ICD-10-CM | POA: Diagnosis not present

## 2017-10-13 DIAGNOSIS — C4492 Squamous cell carcinoma of skin, unspecified: Secondary | ICD-10-CM | POA: Diagnosis not present

## 2017-10-19 DIAGNOSIS — R590 Localized enlarged lymph nodes: Secondary | ICD-10-CM | POA: Diagnosis not present

## 2017-10-19 DIAGNOSIS — N509 Disorder of male genital organs, unspecified: Secondary | ICD-10-CM | POA: Diagnosis not present

## 2017-10-19 DIAGNOSIS — C609 Malignant neoplasm of penis, unspecified: Secondary | ICD-10-CM | POA: Diagnosis not present

## 2017-10-19 DIAGNOSIS — R599 Enlarged lymph nodes, unspecified: Secondary | ICD-10-CM | POA: Diagnosis not present

## 2017-10-19 DIAGNOSIS — Z794 Long term (current) use of insulin: Secondary | ICD-10-CM | POA: Diagnosis not present

## 2017-10-19 DIAGNOSIS — K429 Umbilical hernia without obstruction or gangrene: Secondary | ICD-10-CM | POA: Diagnosis not present

## 2017-10-19 DIAGNOSIS — Z6828 Body mass index (BMI) 28.0-28.9, adult: Secondary | ICD-10-CM | POA: Diagnosis not present

## 2017-10-19 DIAGNOSIS — E119 Type 2 diabetes mellitus without complications: Secondary | ICD-10-CM | POA: Diagnosis not present

## 2017-10-19 DIAGNOSIS — Z79899 Other long term (current) drug therapy: Secondary | ICD-10-CM | POA: Diagnosis not present

## 2017-10-19 DIAGNOSIS — Z880 Allergy status to penicillin: Secondary | ICD-10-CM | POA: Diagnosis not present

## 2017-10-26 DIAGNOSIS — R590 Localized enlarged lymph nodes: Secondary | ICD-10-CM | POA: Diagnosis not present

## 2017-10-26 DIAGNOSIS — C609 Malignant neoplasm of penis, unspecified: Secondary | ICD-10-CM | POA: Diagnosis not present

## 2017-11-10 DIAGNOSIS — C609 Malignant neoplasm of penis, unspecified: Secondary | ICD-10-CM | POA: Diagnosis not present

## 2017-11-10 DIAGNOSIS — Z8549 Personal history of malignant neoplasm of other male genital organs: Secondary | ICD-10-CM | POA: Diagnosis not present

## 2017-11-10 DIAGNOSIS — C774 Secondary and unspecified malignant neoplasm of inguinal and lower limb lymph nodes: Secondary | ICD-10-CM | POA: Diagnosis not present

## 2017-11-10 DIAGNOSIS — R590 Localized enlarged lymph nodes: Secondary | ICD-10-CM | POA: Diagnosis not present

## 2017-11-15 ENCOUNTER — Ambulatory Visit: Payer: BLUE CROSS/BLUE SHIELD | Admitting: Internal Medicine

## 2017-11-15 DIAGNOSIS — Z794 Long term (current) use of insulin: Secondary | ICD-10-CM | POA: Diagnosis not present

## 2017-11-15 DIAGNOSIS — Z880 Allergy status to penicillin: Secondary | ICD-10-CM | POA: Diagnosis not present

## 2017-11-15 DIAGNOSIS — E119 Type 2 diabetes mellitus without complications: Secondary | ICD-10-CM | POA: Diagnosis not present

## 2017-11-15 DIAGNOSIS — N489 Disorder of penis, unspecified: Secondary | ICD-10-CM | POA: Diagnosis not present

## 2017-11-15 DIAGNOSIS — C609 Malignant neoplasm of penis, unspecified: Secondary | ICD-10-CM | POA: Diagnosis not present

## 2017-11-15 DIAGNOSIS — Z6827 Body mass index (BMI) 27.0-27.9, adult: Secondary | ICD-10-CM | POA: Diagnosis not present

## 2017-11-17 ENCOUNTER — Telehealth: Payer: Self-pay | Admitting: Oncology

## 2017-11-17 ENCOUNTER — Encounter: Payer: Self-pay | Admitting: Oncology

## 2017-11-17 NOTE — Telephone Encounter (Signed)
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  Appt has been scheduled for the Elijah Chen to see Dr. Alen Blew on 5/21 at [place] will notify the Elijah Chen. I gave her my direct contact number to give the Elijah Chen incase he needs to reschedule. Letter with the appt date and time mailed to the Elijah Chen.

## 2017-11-28 ENCOUNTER — Telehealth: Payer: Self-pay

## 2017-11-28 ENCOUNTER — Inpatient Hospital Stay: Payer: BLUE CROSS/BLUE SHIELD | Attending: Oncology | Admitting: Oncology

## 2017-11-28 VITALS — BP 119/62 | HR 53 | Temp 98.2°F | Resp 18 | Ht 71.0 in | Wt 194.9 lb

## 2017-11-28 DIAGNOSIS — E119 Type 2 diabetes mellitus without complications: Secondary | ICD-10-CM

## 2017-11-28 DIAGNOSIS — I10 Essential (primary) hypertension: Secondary | ICD-10-CM | POA: Diagnosis not present

## 2017-11-28 DIAGNOSIS — C774 Secondary and unspecified malignant neoplasm of inguinal and lower limb lymph nodes: Secondary | ICD-10-CM | POA: Diagnosis not present

## 2017-11-28 DIAGNOSIS — C609 Malignant neoplasm of penis, unspecified: Secondary | ICD-10-CM

## 2017-11-28 NOTE — Progress Notes (Signed)
Reason for Referral: Penile cancer  HPI: 72 year old gentleman with history of hypertension, hyperlipidemia as well as aortic stenosis.  He has also history of type 2 diabetes currently on insulin.  He started developing testicular swelling and subsequently having issue retracting his foreskin noted in February 2019.  He was evaluated by Dr. Karsten Ro and subsequently underwent circumcision and a biopsy of a glans lesion as well as a draining of a bilateral hydrocele obtained on September 08, 2017.  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  He subsequently was referred to [facility] for further evaluation and management.  He underwent staging work-up including a PET CT scan which showed bilateral hypermetabolic inguinal lymph nodes as well as a right pelvic and external iliac uptake as well.  He underwent a biopsy of his right inguinal lymph node which showed squamous cell carcinoma involvement of those lymph nodes.  Based on these findings, he was recommended by Dr. Vito Berger to undergo neoadjuvant chemotherapy prior to a definitive curative surgical option.  He was set to start chemotherapy utilizing ifosfamide, paclitaxel and cisplatin (TIP) in the near future. For logistical purposes, he wanted to be evaluated closer to home and attempt to get treated locally.  Clinically, he reports no other complaints at this time.  He does report some drainage around his penis but no masses or lesions.  He remains reasonably active.  He does not report any headaches, blurry vision, syncope or seizures. Does not report any fevers, chills or sweats.  Does not report any cough, wheezing or hemoptysis.  Does not report any chest pain, palpitation, orthopnea or leg edema.  Does not report any nausea, vomiting or abdominal pain.  Does not report any constipation or diarrhea.  Does not report any skeletal complaints.    Does not report frequency, urgency or  hematuria.  Does not report any skin rashes or lesions. Does not report any heat or cold intolerance.  Does not report any lymphadenopathy or petechiae.  Does not report any anxiety or depression.  Remaining review of systems is negative.    Past Medical History:  Diagnosis Date  . Bilateral hydrocele   . Heart murmur   . Hyperlipidemia   . Hypertension   . Incisional hernia, with obstruction, without gangrene   . Mild aortic stenosis    per cardioloigst note, dr Wynonia Lawman, dated 05-25-2017 and echo faxed from office on 09-06-2017 AVA 1.17cm^2,  no hx rheumatic fever  . Nocturia   . Penile lesion   . PFO (patent foramen ovale)    small PFO per echo dated 05-15-2017 done at dr Wynonia Lawman office and per dr Wynonia Lawman note 05-25-2017 pt asymptomatic  . Phimosis   . Type 2 diabetes mellitus treated with insulin [facility])    ENDOCRINOLOGIST-  DR Cruzita Lederer--- PER LOV NOTE 08-15-2017 A1c WAS 8.0 THAT DAY  . Urgency of urination   . Wears glasses   :  Past Surgical History:  Procedure Laterality Date  . CARDIAC CATHETERIZATION  2009  approx.   per pt had cath for heart , was told it was ok  . CIRCUMCISION N/A 09/08/2017   Procedure: CIRCUMCISION ADULT, PENILE BIOPSY;  Surgeon: Kathie Rhodes, MD;  Location: [facility];  Service: Urology;  Laterality: N/A;  . CYSTOSCOPY  09/08/2017   Procedure: CYSTOSCOPY FLEXIBLE;  Surgeon: Kathie Rhodes, MD;  Location: [facility];  Service: Urology;;  . Zackery Barefoot W/ SMALL BOWEL RESECTION  age 41  swallowed marbles (bowel gangrene)  . HYDROCELE EXCISION Bilateral 09/08/2017   Procedure: HYDROCELECTOMY ADULT;  Surgeon: Kathie Rhodes, MD;  Location: [facility];  Service: Urology;  Laterality: Bilateral;  . 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  ALPHA-LIPOIC ACID PO, Take 2 capsules by mouth 2 (two) times daily., Disp: , Rfl:  .  Ascorbic Acid (VITA-C PO), Take 2 tablets by mouth every morning. Power C, Disp: , Rfl:  .  aspirin EC 325 MG tablet, Take 325 mg by mouth every 6 (six) hours as needed., Disp: , Rfl:  .  Coenzyme Q10 (COQ-10) 100 MG CAPS, Take 2 capsules by mouth every morning., Disp: , Rfl:  .  glucose blood (BAYER CONTOUR TEST) test strip, 1 each by Other route daily. Use as instructed DX E11.65, Z79.4, Disp: 100 each, Rfl: 1 .  Insulin Pen Needle (PEN NEEDLES 31GX5/16") 31G X 8 MM MISC, Use needles 4 times a day as directed. DX E11.65, Z79.4, Disp: 200 each, Rfl: 11 .  LANTUS SOLOSTAR 100 UNIT/ML Solostar Pen, Inject 45 Units daily after supper into the skin., Disp: 15 mL, Rfl: 5 .  lisinopril-hydrochlorothiazide (PRINZIDE,ZESTORETIC) 20-12.5 MG tablet, Take 1 tablet by mouth every morning. , Disp: , Rfl: 0 .  metFORMIN (GLUCOPHAGE) 1000 MG tablet, TAKE 1 TABLET BY MOUTH TWICE DAILY WITH A MEAL, Disp: 180 tablet, Rfl: 2 .  Misc Natural Products (APPLE CIDER VINEGAR) TABS, Take 2 tablets by mouth 2 (two) times daily. , Disp: , Rfl:  .  NOVOLOG FLEXPEN 100 UNIT/ML FlexPen, Inject 16-20 Units 3 (three) times daily with meals into the skin. (Patient taking differently: Inject 16-20 Units into the skin 3 (three) times daily with meals. ), Disp: 15 mL, Rfl: 11 .  Oxycodone HCl 10 MG TABS, Take 1 tablet (10 mg total) by mouth every 4 (four) hours as needed., Disp: 20 tablet, Rfl: 0 .  pravastatin (PRAVACHOL) 80 MG tablet, Take 80 mg by mouth every morning. , Disp: , Rfl: 0 .  Semaglutide (OZEMPIC) 0.25 or 0.5 MG/DOSE SOPN, Inject 0.5 mg once a week into the skin. (Patient taking differently: Inject 0.5 mg into the skin once a week. Per pt on Saturday's), Disp: 2 pen, Rfl: 11:  Allergies   Allergen Reactions  . Penicillins Hives  :  No family history on file.:  Social History   Socioeconomic History  . Marital status: Married    Spouse name: Not on file  . Number of children: Not on file  . Years of education: Not on file  . Highest education level: Not on file  Occupational History  . Not on file  Social Needs  . Financial resource strain: Not on file  . Food insecurity:    Worry: Not on file    Inability: Not on file  . Transportation needs:    Medical: Not on file    Non-medical: Not on file  Tobacco Use  . Smoking status: Never Smoker  . Smokeless tobacco: Never Used  Substance and Sexual Activity  . Alcohol use: No    Alcohol/week: 0.0 oz  . Drug use: No  . Sexual activity: Not on file  Lifestyle  . Physical activity:    Days per week: Not on file    Minutes per session: Not on file  . Stress: Not on file  Relationships  . Social connections:    Talks on phone: Not on file    Gets together: Not on file    Attends religious service: Not on file    Active member of club or organization: Not on file    Attends meetings of clubs or organizations: Not on file    Relationship status: Not on file  . Intimate partner violence:    Fear of current or ex partner: Not on file    Emotionally abused: Not on file    Physically abused: Not on file    Forced sexual activity: Not on file  Other Topics Concern  . Not on file  Social History Narrative  . Not on file  :  Pertinent items are noted in HPI.  Exam: Blood pressure 119/62, pulse (!) 53, temperature 98.2 F (36.8 C), temperature source Oral, resp. rate 18, height 5\' 11"  (1.803 m), weight 194 lb 14.4 oz (88.4 kg), SpO2 98 %.  ECOG 0 General appearance: alert and cooperative appeared without distress. Head: atraumatic without any abnormalities. Eyes: conjunctivae/corneas clear. PERRL.  Sclera anicteric. Throat: lips, mucosa, and tongue normal; without oral thrush or ulcers. Resp: clear to  auscultation bilaterally without rhonchi, wheezes or dullness to percussion. Cardio: regular rate and rhythm, S1, S2 normal, no murmur, click, rub or gallop GI: soft, non-tender; bowel sounds normal; no masses,  no organomegaly.  GU exam: Slight induration and drainage noted at the glans of the penis. Skin: Skin color, texture, turgor normal. No rashes or lesions Lymph nodes: Cervical, supraclavicular, and axillary nodes normal. Neurologic: Grossly normal without any motor, sensory or deep tendon reflexes. Musculoskeletal: No joint deformity or effusion.  CBC    Component Value Date/Time   HGB 12.2 (L) 09/08/2017 0624   HCT 36.0 (L) 09/08/2017 0624     Chemistry      Component Value Date/Time   NA 141 09/08/2017 0624   K 4.1 09/08/2017 0624   CL 102 09/08/2017 0624   CO2 25 09/04/2015 1526   BUN 16 09/08/2017 0624   CREATININE 1.00 09/08/2017 0624   CREATININE 1.38 (H) 09/04/2015 1526      Component Value Date/Time   CALCIUM 9.1 09/04/2015 1526   ALKPHOS 42 09/04/2015 1526   AST 11 09/04/2015 1526   ALT 11 09/04/2015 1526   BILITOT 0.3 09/04/2015 1526       Assessment and Plan:   72 year old gentleman with the following issues:  1.  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  He has documented disease involving inguinal adenopathy that is a biopsy-proven by percutaneous biopsy.  The natural course of this disease was reviewed today with the patient and his wife extensively.  He appears to have locally advanced disease with regional adenopathy.  Treatment options were reviewed today which include neoadjuvant multi agent cisplatin based chemotherapy followed by curative surgical therapy versus palliative chemotherapy alone versus best supportive care.  Given his age and his excellent performance status I recommended aggressive therapy with neoadjuvant chemotherapy followed by surgery.  He understands there is no  guarantee that this regimen will be curative but can potentially be curative at this time.  Chemotherapy regimens were reviewed included the recommended regimen of ifosfamide, paclitaxel and cisplatin.  Complication associated with this chemotherapy was reviewed in  detail.  These complications include nausea, vomiting, myelosuppression, neutropenia, neutropenic sepsis, renal toxicity, infusion related complications as well as hemorrhagic cystitis as well as ifosfamide neurotoxicity.  I also shared with him literature with multiple institutional experiences with this regimen with reasonable success rate although complete responses are rare postural responses have been rather frequent.  The logistics of administration of this chemotherapy was also discussed today.  There is no care plan for this regimen and needs to be built in the near future to have that done.  This will likely delay the start of his chemotherapy for few weeks which is not ideal.  I am happy to get the process started to build this chemotherapy regimen and getting him started but I cannot guarantee a timely start of this therapy.  I have recommended that he proceed with chemotherapy at [facility] because of expediency issues.  If he elects to have this treatment locally regardless to this limitation we will get the process started as soon as possible.  He will let me know in the immediate future.  2.  IV access will require a Port-A-Cath for a PICC line insertion before the start of chemotherapy for better IV access.  3.  Renal function prophylaxis: His baseline kidney function is adequate and we will monitor him closely if he elects to proceed with chemotherapy here given the nephrotoxic nature of cisplatin.  4.  Follow-up: Is to be determined based on his wishes.  60  minutes was spent with the patient face-to-face today.  More than 50% of time was dedicated to patient counseling, education and discussing his diagnosis,  prognosis treatment options and future plan of care.

## 2017-11-28 NOTE — Telephone Encounter (Signed)
Per 5/21 no los °

## 2017-12-07 DIAGNOSIS — C609 Malignant neoplasm of penis, unspecified: Secondary | ICD-10-CM | POA: Diagnosis not present

## 2017-12-07 DIAGNOSIS — C775 Secondary and unspecified malignant neoplasm of intrapelvic lymph nodes: Secondary | ICD-10-CM | POA: Diagnosis not present

## 2017-12-07 DIAGNOSIS — E669 Obesity, unspecified: Secondary | ICD-10-CM | POA: Diagnosis not present

## 2017-12-07 DIAGNOSIS — Z880 Allergy status to penicillin: Secondary | ICD-10-CM | POA: Diagnosis not present

## 2017-12-07 DIAGNOSIS — Z6827 Body mass index (BMI) 27.0-27.9, adult: Secondary | ICD-10-CM | POA: Diagnosis not present

## 2017-12-07 DIAGNOSIS — E119 Type 2 diabetes mellitus without complications: Secondary | ICD-10-CM | POA: Diagnosis not present

## 2017-12-11 DIAGNOSIS — E109 Type 1 diabetes mellitus without complications: Secondary | ICD-10-CM | POA: Diagnosis not present

## 2017-12-11 DIAGNOSIS — I10 Essential (primary) hypertension: Secondary | ICD-10-CM | POA: Diagnosis not present

## 2017-12-11 DIAGNOSIS — E108 Type 1 diabetes mellitus with unspecified complications: Secondary | ICD-10-CM | POA: Diagnosis not present

## 2017-12-11 DIAGNOSIS — Z794 Long term (current) use of insulin: Secondary | ICD-10-CM | POA: Diagnosis not present

## 2017-12-11 DIAGNOSIS — Z5111 Encounter for antineoplastic chemotherapy: Secondary | ICD-10-CM | POA: Diagnosis not present

## 2017-12-11 DIAGNOSIS — C601 Malignant neoplasm of glans penis: Secondary | ICD-10-CM | POA: Diagnosis not present

## 2017-12-11 DIAGNOSIS — Z880 Allergy status to penicillin: Secondary | ICD-10-CM | POA: Diagnosis not present

## 2017-12-11 DIAGNOSIS — Z6829 Body mass index (BMI) 29.0-29.9, adult: Secondary | ICD-10-CM | POA: Diagnosis not present

## 2017-12-11 DIAGNOSIS — C779 Secondary and unspecified malignant neoplasm of lymph node, unspecified: Secondary | ICD-10-CM | POA: Diagnosis not present

## 2017-12-11 DIAGNOSIS — E119 Type 2 diabetes mellitus without complications: Secondary | ICD-10-CM | POA: Diagnosis not present

## 2017-12-12 DIAGNOSIS — Z6829 Body mass index (BMI) 29.0-29.9, adult: Secondary | ICD-10-CM | POA: Diagnosis not present

## 2017-12-12 DIAGNOSIS — E109 Type 1 diabetes mellitus without complications: Secondary | ICD-10-CM | POA: Diagnosis not present

## 2017-12-12 DIAGNOSIS — Z5111 Encounter for antineoplastic chemotherapy: Secondary | ICD-10-CM | POA: Diagnosis not present

## 2017-12-12 DIAGNOSIS — C601 Malignant neoplasm of glans penis: Secondary | ICD-10-CM | POA: Diagnosis not present

## 2017-12-13 DIAGNOSIS — Z6829 Body mass index (BMI) 29.0-29.9, adult: Secondary | ICD-10-CM | POA: Diagnosis not present

## 2017-12-13 DIAGNOSIS — C601 Malignant neoplasm of glans penis: Secondary | ICD-10-CM | POA: Diagnosis not present

## 2017-12-13 DIAGNOSIS — Z5111 Encounter for antineoplastic chemotherapy: Secondary | ICD-10-CM | POA: Diagnosis not present

## 2017-12-13 DIAGNOSIS — E109 Type 1 diabetes mellitus without complications: Secondary | ICD-10-CM | POA: Diagnosis not present

## 2017-12-14 DIAGNOSIS — Z6829 Body mass index (BMI) 29.0-29.9, adult: Secondary | ICD-10-CM | POA: Diagnosis not present

## 2017-12-14 DIAGNOSIS — C601 Malignant neoplasm of glans penis: Secondary | ICD-10-CM | POA: Diagnosis not present

## 2017-12-14 DIAGNOSIS — Z5111 Encounter for antineoplastic chemotherapy: Secondary | ICD-10-CM | POA: Diagnosis not present

## 2017-12-14 DIAGNOSIS — E108 Type 1 diabetes mellitus with unspecified complications: Secondary | ICD-10-CM | POA: Diagnosis not present

## 2017-12-15 DIAGNOSIS — C609 Malignant neoplasm of penis, unspecified: Secondary | ICD-10-CM | POA: Diagnosis not present

## 2017-12-16 DIAGNOSIS — C609 Malignant neoplasm of penis, unspecified: Secondary | ICD-10-CM | POA: Diagnosis not present

## 2017-12-20 DIAGNOSIS — C609 Malignant neoplasm of penis, unspecified: Secondary | ICD-10-CM | POA: Diagnosis not present

## 2017-12-21 DIAGNOSIS — C609 Malignant neoplasm of penis, unspecified: Secondary | ICD-10-CM | POA: Diagnosis not present

## 2017-12-25 DIAGNOSIS — C609 Malignant neoplasm of penis, unspecified: Secondary | ICD-10-CM | POA: Diagnosis not present

## 2017-12-26 DIAGNOSIS — C609 Malignant neoplasm of penis, unspecified: Secondary | ICD-10-CM | POA: Diagnosis not present

## 2017-12-26 DIAGNOSIS — E860 Dehydration: Secondary | ICD-10-CM | POA: Diagnosis not present

## 2017-12-28 DIAGNOSIS — E1142 Type 2 diabetes mellitus with diabetic polyneuropathy: Secondary | ICD-10-CM | POA: Diagnosis not present

## 2017-12-28 DIAGNOSIS — Z880 Allergy status to penicillin: Secondary | ICD-10-CM | POA: Diagnosis not present

## 2017-12-28 DIAGNOSIS — E669 Obesity, unspecified: Secondary | ICD-10-CM | POA: Diagnosis not present

## 2017-12-28 DIAGNOSIS — Z452 Encounter for adjustment and management of vascular access device: Secondary | ICD-10-CM | POA: Diagnosis not present

## 2017-12-28 DIAGNOSIS — C609 Malignant neoplasm of penis, unspecified: Secondary | ICD-10-CM | POA: Diagnosis not present

## 2017-12-28 DIAGNOSIS — Z6828 Body mass index (BMI) 28.0-28.9, adult: Secondary | ICD-10-CM | POA: Diagnosis not present

## 2018-01-01 DIAGNOSIS — J3489 Other specified disorders of nose and nasal sinuses: Secondary | ICD-10-CM | POA: Diagnosis not present

## 2018-01-01 DIAGNOSIS — Z6829 Body mass index (BMI) 29.0-29.9, adult: Secondary | ICD-10-CM | POA: Diagnosis not present

## 2018-01-01 DIAGNOSIS — Z5111 Encounter for antineoplastic chemotherapy: Secondary | ICD-10-CM | POA: Diagnosis not present

## 2018-01-01 DIAGNOSIS — E1165 Type 2 diabetes mellitus with hyperglycemia: Secondary | ICD-10-CM | POA: Diagnosis not present

## 2018-01-01 DIAGNOSIS — I10 Essential (primary) hypertension: Secondary | ICD-10-CM | POA: Diagnosis not present

## 2018-01-01 DIAGNOSIS — Z880 Allergy status to penicillin: Secondary | ICD-10-CM | POA: Diagnosis not present

## 2018-01-01 DIAGNOSIS — C601 Malignant neoplasm of glans penis: Secondary | ICD-10-CM | POA: Diagnosis not present

## 2018-01-01 DIAGNOSIS — K5900 Constipation, unspecified: Secondary | ICD-10-CM | POA: Diagnosis not present

## 2018-01-01 DIAGNOSIS — Z794 Long term (current) use of insulin: Secondary | ICD-10-CM | POA: Diagnosis not present

## 2018-01-01 DIAGNOSIS — C609 Malignant neoplasm of penis, unspecified: Secondary | ICD-10-CM | POA: Diagnosis not present

## 2018-01-01 DIAGNOSIS — E119 Type 2 diabetes mellitus without complications: Secondary | ICD-10-CM | POA: Diagnosis not present

## 2018-01-01 DIAGNOSIS — R634 Abnormal weight loss: Secondary | ICD-10-CM | POA: Diagnosis not present

## 2018-01-02 DIAGNOSIS — E119 Type 2 diabetes mellitus without complications: Secondary | ICD-10-CM | POA: Diagnosis not present

## 2018-01-02 DIAGNOSIS — C609 Malignant neoplasm of penis, unspecified: Secondary | ICD-10-CM | POA: Diagnosis not present

## 2018-01-02 DIAGNOSIS — Z5111 Encounter for antineoplastic chemotherapy: Secondary | ICD-10-CM | POA: Diagnosis not present

## 2018-01-02 DIAGNOSIS — C601 Malignant neoplasm of glans penis: Secondary | ICD-10-CM | POA: Diagnosis not present

## 2018-01-02 DIAGNOSIS — I10 Essential (primary) hypertension: Secondary | ICD-10-CM | POA: Diagnosis not present

## 2018-01-02 DIAGNOSIS — K5900 Constipation, unspecified: Secondary | ICD-10-CM | POA: Diagnosis not present

## 2018-01-03 DIAGNOSIS — Z5111 Encounter for antineoplastic chemotherapy: Secondary | ICD-10-CM | POA: Diagnosis not present

## 2018-01-03 DIAGNOSIS — I10 Essential (primary) hypertension: Secondary | ICD-10-CM | POA: Diagnosis not present

## 2018-01-03 DIAGNOSIS — C609 Malignant neoplasm of penis, unspecified: Secondary | ICD-10-CM | POA: Diagnosis not present

## 2018-01-03 DIAGNOSIS — K5900 Constipation, unspecified: Secondary | ICD-10-CM | POA: Diagnosis not present

## 2018-01-03 DIAGNOSIS — E1165 Type 2 diabetes mellitus with hyperglycemia: Secondary | ICD-10-CM | POA: Diagnosis not present

## 2018-01-03 DIAGNOSIS — C601 Malignant neoplasm of glans penis: Secondary | ICD-10-CM | POA: Diagnosis not present

## 2018-01-04 ENCOUNTER — Telehealth: Payer: Self-pay

## 2018-01-04 ENCOUNTER — Other Ambulatory Visit: Payer: Self-pay | Admitting: Oncology

## 2018-01-04 ENCOUNTER — Telehealth: Payer: Self-pay | Admitting: Oncology

## 2018-01-04 DIAGNOSIS — Z5111 Encounter for antineoplastic chemotherapy: Secondary | ICD-10-CM | POA: Diagnosis not present

## 2018-01-04 DIAGNOSIS — C609 Malignant neoplasm of penis, unspecified: Secondary | ICD-10-CM | POA: Diagnosis not present

## 2018-01-04 DIAGNOSIS — E119 Type 2 diabetes mellitus without complications: Secondary | ICD-10-CM | POA: Diagnosis not present

## 2018-01-04 NOTE — Telephone Encounter (Signed)
Received a call that patient needed a f/u with [facility]. (RESPOND) No f/u needed instead he only needs a injection only. He provided a date and afterwards I called RN back at [place] she informed me that she had already scheduled a injection there and it's okay. To not schedule the injection here at [facility]

## 2018-01-04 NOTE — Telephone Encounter (Signed)
Called pt re appts that were added per 6/27 sch msg - left vm for pt re appts.  °

## 2018-01-05 ENCOUNTER — Inpatient Hospital Stay: Payer: BLUE CROSS/BLUE SHIELD | Attending: Oncology

## 2018-01-05 DIAGNOSIS — C609 Malignant neoplasm of penis, unspecified: Secondary | ICD-10-CM | POA: Diagnosis not present

## 2018-01-05 MED ORDER — GENERIC EXTERNAL MEDICATION
2.00 | Status: DC
Start: ? — End: 2018-01-05

## 2018-01-05 MED ORDER — ACETAMINOPHEN 325 MG PO TABS
650.00 | ORAL_TABLET | ORAL | Status: DC
Start: ? — End: 2018-01-05

## 2018-01-05 MED ORDER — INSULIN GLARGINE 100 UNIT/ML ~~LOC~~ SOLN
50.00 | SUBCUTANEOUS | Status: DC
Start: 2018-01-04 — End: 2018-01-05

## 2018-01-05 MED ORDER — FAMOTIDINE 20 MG/2ML IV SOLN
20.00 | INTRAVENOUS | Status: DC
Start: ? — End: 2018-01-05

## 2018-01-05 MED ORDER — POLYETHYLENE GLYCOL 3350 17 G PO PACK
17.00 | PACK | ORAL | Status: DC
Start: ? — End: 2018-01-05

## 2018-01-05 MED ORDER — SODIUM CHLORIDE 0.9 % IV SOLN
1000.00 | INTRAVENOUS | Status: DC
Start: ? — End: 2018-01-05

## 2018-01-05 MED ORDER — GENERIC EXTERNAL MEDICATION
Status: DC
Start: ? — End: 2018-01-05

## 2018-01-05 MED ORDER — INSULIN LISPRO 100 UNIT/ML ~~LOC~~ SOLN
0.00 | SUBCUTANEOUS | Status: DC
Start: 2018-01-04 — End: 2018-01-05

## 2018-01-05 MED ORDER — PROCHLORPERAZINE MALEATE 5 MG PO TABS
10.00 | ORAL_TABLET | ORAL | Status: DC
Start: ? — End: 2018-01-05

## 2018-01-05 MED ORDER — MEPERIDINE HCL 25 MG/ML IJ SOLN
25.00 | INTRAMUSCULAR | Status: DC
Start: ? — End: 2018-01-05

## 2018-01-05 MED ORDER — GENERIC EXTERNAL MEDICATION
10.00 | Status: DC
Start: ? — End: 2018-01-05

## 2018-01-05 MED ORDER — EPINEPHRINE 0.3 MG/0.3ML IJ SOAJ
0.30 | INTRAMUSCULAR | Status: DC
Start: ? — End: 2018-01-05

## 2018-01-05 MED ORDER — SODIUM CHLORIDE 0.9 % IV SOLN
100.00 | INTRAVENOUS | Status: DC
Start: ? — End: 2018-01-05

## 2018-01-05 MED ORDER — PRAVASTATIN SODIUM 80 MG PO TABS
80.00 | ORAL_TABLET | ORAL | Status: DC
Start: 2018-01-05 — End: 2018-01-05

## 2018-01-05 MED ORDER — METHYLPREDNISOLONE SODIUM SUCC 125 MG IJ SOLR
125.00 | INTRAMUSCULAR | Status: DC
Start: ? — End: 2018-01-05

## 2018-01-05 MED ORDER — VITAMIN B-12 1000 MCG PO TABS
500.00 | ORAL_TABLET | ORAL | Status: DC
Start: 2018-01-04 — End: 2018-01-05

## 2018-01-05 MED ORDER — SODIUM CHLORIDE 0.9 % IV SOLN
20.00 | INTRAVENOUS | Status: DC
Start: ? — End: 2018-01-05

## 2018-01-05 MED ORDER — DEXTROSE 10 % IV SOLN
12.50 | INTRAVENOUS | Status: DC
Start: ? — End: 2018-01-05

## 2018-01-05 MED ORDER — DIPHENHYDRAMINE HCL 50 MG/ML IJ SOLN
25.00 | INTRAMUSCULAR | Status: DC
Start: ? — End: 2018-01-05

## 2018-01-08 DIAGNOSIS — C609 Malignant neoplasm of penis, unspecified: Secondary | ICD-10-CM | POA: Diagnosis not present

## 2018-01-09 DIAGNOSIS — C609 Malignant neoplasm of penis, unspecified: Secondary | ICD-10-CM | POA: Diagnosis not present

## 2018-01-09 DIAGNOSIS — E860 Dehydration: Secondary | ICD-10-CM | POA: Diagnosis not present

## 2018-01-18 DIAGNOSIS — Z6829 Body mass index (BMI) 29.0-29.9, adult: Secondary | ICD-10-CM | POA: Diagnosis not present

## 2018-01-18 DIAGNOSIS — C609 Malignant neoplasm of penis, unspecified: Secondary | ICD-10-CM | POA: Diagnosis not present

## 2018-01-22 DIAGNOSIS — H9202 Otalgia, left ear: Secondary | ICD-10-CM | POA: Diagnosis not present

## 2018-01-22 DIAGNOSIS — C609 Malignant neoplasm of penis, unspecified: Secondary | ICD-10-CM | POA: Diagnosis not present

## 2018-01-30 ENCOUNTER — Encounter: Payer: Self-pay | Admitting: Internal Medicine

## 2018-01-30 LAB — HM DIABETES EYE EXAM

## 2018-02-05 DIAGNOSIS — C601 Malignant neoplasm of glans penis: Secondary | ICD-10-CM | POA: Diagnosis not present

## 2018-02-05 DIAGNOSIS — I10 Essential (primary) hypertension: Secondary | ICD-10-CM | POA: Diagnosis not present

## 2018-02-05 DIAGNOSIS — Z6829 Body mass index (BMI) 29.0-29.9, adult: Secondary | ICD-10-CM | POA: Diagnosis not present

## 2018-02-05 DIAGNOSIS — R5383 Other fatigue: Secondary | ICD-10-CM | POA: Diagnosis not present

## 2018-02-05 DIAGNOSIS — G9009 Other idiopathic peripheral autonomic neuropathy: Secondary | ICD-10-CM | POA: Diagnosis not present

## 2018-02-05 DIAGNOSIS — T380X5A Adverse effect of glucocorticoids and synthetic analogues, initial encounter: Secondary | ICD-10-CM | POA: Diagnosis not present

## 2018-02-05 DIAGNOSIS — E1165 Type 2 diabetes mellitus with hyperglycemia: Secondary | ICD-10-CM | POA: Diagnosis not present

## 2018-02-05 DIAGNOSIS — E1142 Type 2 diabetes mellitus with diabetic polyneuropathy: Secondary | ICD-10-CM | POA: Diagnosis not present

## 2018-02-05 DIAGNOSIS — E669 Obesity, unspecified: Secondary | ICD-10-CM | POA: Diagnosis not present

## 2018-02-05 DIAGNOSIS — Z6828 Body mass index (BMI) 28.0-28.9, adult: Secondary | ICD-10-CM | POA: Diagnosis not present

## 2018-02-05 DIAGNOSIS — E11649 Type 2 diabetes mellitus with hypoglycemia without coma: Secondary | ICD-10-CM | POA: Diagnosis not present

## 2018-02-05 DIAGNOSIS — Z794 Long term (current) use of insulin: Secondary | ICD-10-CM | POA: Diagnosis not present

## 2018-02-05 DIAGNOSIS — C609 Malignant neoplasm of penis, unspecified: Secondary | ICD-10-CM | POA: Diagnosis not present

## 2018-02-05 DIAGNOSIS — Z5111 Encounter for antineoplastic chemotherapy: Secondary | ICD-10-CM | POA: Diagnosis not present

## 2018-02-06 DIAGNOSIS — E1165 Type 2 diabetes mellitus with hyperglycemia: Secondary | ICD-10-CM | POA: Diagnosis not present

## 2018-02-06 DIAGNOSIS — G9009 Other idiopathic peripheral autonomic neuropathy: Secondary | ICD-10-CM | POA: Diagnosis not present

## 2018-02-06 DIAGNOSIS — C609 Malignant neoplasm of penis, unspecified: Secondary | ICD-10-CM | POA: Diagnosis not present

## 2018-02-06 DIAGNOSIS — Z5111 Encounter for antineoplastic chemotherapy: Secondary | ICD-10-CM | POA: Diagnosis not present

## 2018-02-07 DIAGNOSIS — Z6829 Body mass index (BMI) 29.0-29.9, adult: Secondary | ICD-10-CM | POA: Diagnosis not present

## 2018-02-07 DIAGNOSIS — C609 Malignant neoplasm of penis, unspecified: Secondary | ICD-10-CM | POA: Diagnosis not present

## 2018-02-07 DIAGNOSIS — Z5111 Encounter for antineoplastic chemotherapy: Secondary | ICD-10-CM | POA: Diagnosis not present

## 2018-02-08 DIAGNOSIS — C609 Malignant neoplasm of penis, unspecified: Secondary | ICD-10-CM | POA: Diagnosis not present

## 2018-02-08 DIAGNOSIS — Z5111 Encounter for antineoplastic chemotherapy: Secondary | ICD-10-CM | POA: Diagnosis not present

## 2018-02-08 DIAGNOSIS — Z6829 Body mass index (BMI) 29.0-29.9, adult: Secondary | ICD-10-CM | POA: Diagnosis not present

## 2018-02-09 DIAGNOSIS — C609 Malignant neoplasm of penis, unspecified: Secondary | ICD-10-CM | POA: Diagnosis not present

## 2018-02-12 DIAGNOSIS — M25569 Pain in unspecified knee: Secondary | ICD-10-CM | POA: Diagnosis not present

## 2018-02-12 DIAGNOSIS — E669 Obesity, unspecified: Secondary | ICD-10-CM | POA: Diagnosis not present

## 2018-02-12 DIAGNOSIS — Z880 Allergy status to penicillin: Secondary | ICD-10-CM | POA: Diagnosis not present

## 2018-02-12 DIAGNOSIS — Z6828 Body mass index (BMI) 28.0-28.9, adult: Secondary | ICD-10-CM | POA: Diagnosis not present

## 2018-02-12 DIAGNOSIS — E1142 Type 2 diabetes mellitus with diabetic polyneuropathy: Secondary | ICD-10-CM | POA: Diagnosis not present

## 2018-02-12 DIAGNOSIS — C609 Malignant neoplasm of penis, unspecified: Secondary | ICD-10-CM | POA: Diagnosis not present

## 2018-02-19 DIAGNOSIS — E669 Obesity, unspecified: Secondary | ICD-10-CM | POA: Diagnosis not present

## 2018-02-19 DIAGNOSIS — Z794 Long term (current) use of insulin: Secondary | ICD-10-CM | POA: Diagnosis not present

## 2018-02-19 DIAGNOSIS — E119 Type 2 diabetes mellitus without complications: Secondary | ICD-10-CM | POA: Diagnosis not present

## 2018-02-19 DIAGNOSIS — Z6828 Body mass index (BMI) 28.0-28.9, adult: Secondary | ICD-10-CM | POA: Diagnosis not present

## 2018-02-19 DIAGNOSIS — C601 Malignant neoplasm of glans penis: Secondary | ICD-10-CM | POA: Diagnosis not present

## 2018-02-19 DIAGNOSIS — C609 Malignant neoplasm of penis, unspecified: Secondary | ICD-10-CM | POA: Diagnosis not present

## 2018-02-19 DIAGNOSIS — Z880 Allergy status to penicillin: Secondary | ICD-10-CM | POA: Diagnosis not present

## 2018-02-22 ENCOUNTER — Other Ambulatory Visit: Payer: Self-pay | Admitting: Internal Medicine

## 2018-03-07 DIAGNOSIS — Z794 Long term (current) use of insulin: Secondary | ICD-10-CM | POA: Diagnosis not present

## 2018-03-07 DIAGNOSIS — E11649 Type 2 diabetes mellitus with hypoglycemia without coma: Secondary | ICD-10-CM | POA: Diagnosis not present

## 2018-03-07 DIAGNOSIS — Z880 Allergy status to penicillin: Secondary | ICD-10-CM | POA: Diagnosis not present

## 2018-03-07 DIAGNOSIS — I10 Essential (primary) hypertension: Secondary | ICD-10-CM | POA: Diagnosis not present

## 2018-03-07 DIAGNOSIS — E1142 Type 2 diabetes mellitus with diabetic polyneuropathy: Secondary | ICD-10-CM | POA: Diagnosis not present

## 2018-03-07 DIAGNOSIS — C609 Malignant neoplasm of penis, unspecified: Secondary | ICD-10-CM | POA: Diagnosis not present

## 2018-03-07 DIAGNOSIS — E1140 Type 2 diabetes mellitus with diabetic neuropathy, unspecified: Secondary | ICD-10-CM | POA: Diagnosis not present

## 2018-03-07 DIAGNOSIS — Z5111 Encounter for antineoplastic chemotherapy: Secondary | ICD-10-CM | POA: Diagnosis not present

## 2018-03-08 DIAGNOSIS — E11649 Type 2 diabetes mellitus with hypoglycemia without coma: Secondary | ICD-10-CM | POA: Diagnosis not present

## 2018-03-08 DIAGNOSIS — Z794 Long term (current) use of insulin: Secondary | ICD-10-CM | POA: Diagnosis not present

## 2018-03-08 DIAGNOSIS — E1140 Type 2 diabetes mellitus with diabetic neuropathy, unspecified: Secondary | ICD-10-CM | POA: Diagnosis not present

## 2018-03-08 DIAGNOSIS — C609 Malignant neoplasm of penis, unspecified: Secondary | ICD-10-CM | POA: Diagnosis not present

## 2018-03-08 DIAGNOSIS — Z5111 Encounter for antineoplastic chemotherapy: Secondary | ICD-10-CM | POA: Diagnosis not present

## 2018-03-09 DIAGNOSIS — Z5111 Encounter for antineoplastic chemotherapy: Secondary | ICD-10-CM | POA: Diagnosis not present

## 2018-03-09 DIAGNOSIS — C609 Malignant neoplasm of penis, unspecified: Secondary | ICD-10-CM | POA: Diagnosis not present

## 2018-03-09 DIAGNOSIS — Z794 Long term (current) use of insulin: Secondary | ICD-10-CM | POA: Diagnosis not present

## 2018-03-09 DIAGNOSIS — E11649 Type 2 diabetes mellitus with hypoglycemia without coma: Secondary | ICD-10-CM | POA: Diagnosis not present

## 2018-03-09 DIAGNOSIS — E1140 Type 2 diabetes mellitus with diabetic neuropathy, unspecified: Secondary | ICD-10-CM | POA: Diagnosis not present

## 2018-03-10 DIAGNOSIS — E1140 Type 2 diabetes mellitus with diabetic neuropathy, unspecified: Secondary | ICD-10-CM | POA: Diagnosis not present

## 2018-03-10 DIAGNOSIS — Z5111 Encounter for antineoplastic chemotherapy: Secondary | ICD-10-CM | POA: Diagnosis not present

## 2018-03-10 DIAGNOSIS — C609 Malignant neoplasm of penis, unspecified: Secondary | ICD-10-CM | POA: Diagnosis not present

## 2018-03-10 DIAGNOSIS — Z794 Long term (current) use of insulin: Secondary | ICD-10-CM | POA: Diagnosis not present

## 2018-03-10 DIAGNOSIS — E11649 Type 2 diabetes mellitus with hypoglycemia without coma: Secondary | ICD-10-CM | POA: Diagnosis not present

## 2018-03-11 DIAGNOSIS — C609 Malignant neoplasm of penis, unspecified: Secondary | ICD-10-CM | POA: Diagnosis not present

## 2018-03-11 DIAGNOSIS — Z7689 Persons encountering health services in other specified circumstances: Secondary | ICD-10-CM | POA: Diagnosis not present

## 2018-03-19 DIAGNOSIS — C609 Malignant neoplasm of penis, unspecified: Secondary | ICD-10-CM | POA: Diagnosis not present

## 2018-03-21 ENCOUNTER — Encounter: Payer: Self-pay | Admitting: Internal Medicine

## 2018-03-21 ENCOUNTER — Telehealth (INDEPENDENT_AMBULATORY_CARE_PROVIDER_SITE_OTHER): Payer: BLUE CROSS/BLUE SHIELD

## 2018-03-21 ENCOUNTER — Ambulatory Visit: Payer: BLUE CROSS/BLUE SHIELD | Admitting: Internal Medicine

## 2018-03-21 VITALS — BP 138/80 | HR 73 | Ht 71.0 in | Wt 185.6 lb

## 2018-03-21 DIAGNOSIS — E1165 Type 2 diabetes mellitus with hyperglycemia: Secondary | ICD-10-CM | POA: Diagnosis not present

## 2018-03-21 DIAGNOSIS — E6609 Other obesity due to excess calories: Secondary | ICD-10-CM | POA: Diagnosis not present

## 2018-03-21 DIAGNOSIS — IMO0002 Reserved for concepts with insufficient information to code with codable children: Secondary | ICD-10-CM

## 2018-03-21 DIAGNOSIS — Z6831 Body mass index (BMI) 31.0-31.9, adult: Secondary | ICD-10-CM

## 2018-03-21 DIAGNOSIS — Z23 Encounter for immunization: Secondary | ICD-10-CM | POA: Diagnosis not present

## 2018-03-21 DIAGNOSIS — E1142 Type 2 diabetes mellitus with diabetic polyneuropathy: Secondary | ICD-10-CM | POA: Diagnosis not present

## 2018-03-21 LAB — POCT GLYCOSYLATED HEMOGLOBIN (HGB A1C): Hemoglobin A1C: 7.5 % — AB (ref 4.0–5.6)

## 2018-03-21 MED ORDER — LANTUS SOLOSTAR 100 UNIT/ML ~~LOC~~ SOPN: 48.0000 [IU] | PEN_INJECTOR | Freq: Every day | SUBCUTANEOUS | 5 refills | Status: DC

## 2018-03-21 NOTE — Patient Instructions (Signed)
Please increase: - Lantus 48 units in the evening - Novolog 15 min before meals: 14-16 units  Please continue: - Metformin with dinner: 2000 mg - Ozempic 0.5 mg weekly.  Please return in 3 months with your sugar log.

## 2018-03-21 NOTE — Progress Notes (Signed)
Patient ID: Elijah Chen, male   DOB: [birth year], 72 y.o.   MRN: [id number]  HPI: Elijah Chen is a 72 y.o.-year-old male, returning for f/u for DM2, dx in 2006, insulin-dependent since dx, uncontrolled, with complications (peripheral neuropathy). He moved here from [place], where he was followed by endocrinology. Last visit 6 months ago.  Since last OV, he was dx'ed with penile CA. Had 4 sessions of ChTx >> finished 2 weeks ago.  He changed his diet since last visit >> lost weight.  Sugars are slightly higher after he got out of the hospital 2 weeks ago.  He also started to gain weight back.  Last hemoglobin A1c was: 03/07/2018: HbA1c 7.8%. Lab Results  Component Value Date   HGBA1C 8.0 (A) 08/15/2017   HGBA1C 9.5 05/15/2017   HGBA1C 8.9 12/13/2016  05/11/2017: HbA1c 9.8% 04/2015: HbA1c 13% 2015: Hba1c 7%  Pt is on: - Lantus 45 >> 44 units in the evening - Metformin with dinner: 2000 mg - Novolog 15 min before meals: 12 units before B and L  >> 14 units 10 units before D >> 14 units - Ozempic 0.5 mg weekly. He also takes Gaffer 450 mg 2 tab 2x a day - for neuropathy  Pt checks his sugars 1-3x a day - higher at home: - am: 188-250 >> 88-180, 190, 224 >> 175-230 - 2h after b'fast:  150s >> 252 >> n/c >> 137-301 - before lunch: 150-190 >> n/c >> 94-164 >> 304, 334 - 2h after lunch: 78, 158-287 >> n/c >> 92, 137 >> n/c - before dinner: 130s >> n/c >>  82, 89-182 >> 93, 231-385 - 2h after dinner:n/c >> 53, 54, 64, 67, 113-185 >> 92-263 - bedtime:140-150s >> n/c >> 70, 126-165 >> n/c - nighttime: n/c Lowest sugar was 53 >> 47 (in hospital, during ChTx). He has hypoglycemia awareness in the 70s. Highest sugar was  224 >> 385.  Pt's meals are: - Breakfast: oatmeal + peaches  - Lunch: sandwich - Dinner: soups, meat + veggies or salad - Snacks: occasional sweets (icecream) No sodas.  -+ CKD. Reviewed labs: 03/19/2018: Glu 160, BUN/creatinine 15/0.84, GFR  87 05/11/2017: Glu 186, BUN/creatinine 15/0.92, GFR 81 05/03/2016: 17/1.06 Lab Results  Component Value Date   BUN 16 09/08/2017   CREATININE 1.00 09/08/2017  05/21/2015: ACR 3.36  -+ HL: last Lipid panel: 05/11/2017: 150/132/39/85 05/03/2016: 175/142/46/100 No results found for: CHOL, HDL, LDLCALC, LDLDIRECT, TRIG, CHOLHDL  On pravastatin.  - last eye exam -09/2015: No DR.  -+ Mild numbness and tingling in his feet.  On alpha lipoic acid, B12, apple cider vinegar  Other labs from PCP - drawn 05/11/2017: TSH 1.64 B12 432  ROS: Constitutional: + weight loss, no fatigue, no subjective hyperthermia, no subjective hypothermia Eyes: no blurry vision, no xerophthalmia ENT: no sore throat, no nodules palpated in throat, no dysphagia, no odynophagia, no hoarseness Cardiovascular: no CP/no SOB/no palpitations/no leg swelling Respiratory: no cough/no SOB/no wheezing Gastrointestinal: no N/no V/no D/no C/no acid reflux Musculoskeletal: no muscle aches/no joint aches Skin: no rashes, + hair loss Neurological: no tremors/no numbness/no tingling/no dizziness  I reviewed pt's medications, allergies, PMH, social hx, family hx, and changes were documented in the history of present illness. Otherwise, unchanged from my initial visit note.  Patient Active Problem List   Diagnosis Date Noted  . Peripheral sensory neuropathy due to type 2 diabetes mellitus ([place]) 11/30/2015    Priority: High  . Uncontrolled type 2 diabetes mellitus  with peripheral neuropathy ([place]) 09/04/2015    Priority: High  . Class 1 obesity due to excess calories with serious comorbidity and body mass index (BMI) of 31.0 to 31.9 in adult 08/15/2017    Priority: Medium  . Penis cancer ([place]) 01/04/2018    Social History   Social History  . Marital Status: Married    Spouse Name: N/A   Social History Main Topics  . Smoking status: Never Smoker   . Smokeless tobacco: Not on file  . Alcohol Use: No  . Drug Use: No    Current Outpatient Medications on File Prior to Visit  Medication Sig Dispense Refill  . ALPHA-LIPOIC ACID PO Take 2 capsules by mouth 2 (two) times daily.    . Ascorbic Acid (VITA-C PO) Take 2 tablets by mouth every morning. Power C    . aspirin EC 325 MG tablet Take 325 mg by mouth every 6 (six) hours as needed.    . Coenzyme Q10 (COQ-10) 100 MG CAPS Take 2 capsules by mouth every morning.    Marland Kitchen glucose blood (BAYER CONTOUR TEST) test strip 1 each by Other route daily. Use as instructed DX E11.65, Z79.4 100 each 1  . Insulin Pen Needle (PEN NEEDLES 31GX5/16") 31G X 8 MM MISC Use needles 4 times a day as directed. DX E11.65, Z79.4 200 each 11  . LANTUS SOLOSTAR 100 UNIT/ML Solostar Pen Inject 45 Units daily after supper into the skin. 15 mL 5  . LANTUS SOLOSTAR 100 UNIT/ML Solostar Pen INJECT 45 UNITS INTO THE SKIN AT BEDTIME 15 pen 2  . lisinopril-hydrochlorothiazide (PRINZIDE,ZESTORETIC) 20-12.5 MG tablet Take 1 tablet by mouth every morning.   0  . metFORMIN (GLUCOPHAGE) 1000 MG tablet TAKE 1 TABLET BY MOUTH TWICE DAILY WITH A MEAL 180 tablet 2  . Misc Natural Products (APPLE CIDER VINEGAR) TABS Take 2 tablets by mouth 2 (two) times daily.     Marland Kitchen NOVOLOG FLEXPEN 100 UNIT/ML FlexPen Inject 16-20 Units 3 (three) times daily with meals into the skin. (Patient taking differently: Inject 16-20 Units into the skin 3 (three) times daily with meals. ) 15 mL 11  . Oxycodone HCl 10 MG TABS Take 1 tablet (10 mg total) by mouth every 4 (four) hours as needed. 20 tablet 0  . pravastatin (PRAVACHOL) 80 MG tablet Take 80 mg by mouth every morning.   0  . Semaglutide (OZEMPIC) 0.25 or 0.5 MG/DOSE SOPN Inject 0.5 mg once a week into the skin. (Patient taking differently: Inject 0.5 mg into the skin once a week. Per pt on Saturday's) 2 pen 11   No current facility-administered medications on file prior to visit.     Allergies  Allergen Reactions  . Penicillins Hives   No family history of  diabetes.  PE: BP 138/80   Pulse 73   Ht 5\' 11"  (1.803 m)   Wt 185 lb 9.6 oz (84.2 kg)   SpO2 98%   BMI 25.89 kg/m  Body mass index is 25.89 kg/m. Wt Readings from Last 3 Encounters:  03/21/18 185 lb 9.6 oz (84.2 kg)  11/28/17 194 lb 14.4 oz (88.4 kg)  09/08/17 207 lb 14.4 oz (94.3 kg)   Constitutional: overweight, in NAD Eyes: PERRLA, EOMI, no exophthalmos ENT: moist mucous membranes, no thyromegaly, no cervical lymphadenopathy Cardiovascular: RRR, No RG, + 1/6 SEM Respiratory: CTA B Gastrointestinal: abdomen soft, NT, ND, BS+ Musculoskeletal: no deformities, strength intact in all 4 Skin: moist, warm, no rashes Neurological: no tremor with outstretched  hands, DTR normal in all 4   ASSESSMENT: 1. 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  Obesity class 1  PLAN:  1. Patient with long-standing, uncontrolled, type 2 diabetes, on basal-bolus insulin regimen + oral antidiabetic regimen and GLP-1 receptor agonist.  He ran out of Ozempic before last visit and he had more stress in his life and more dietary indulgences.  As a consequence, his sugars were much higher at that time.  He was also not using the recommended NovoLog doses and he was skipping meals, especially breakfast.  His HbA1c was 8% then.  We restarted Ozempic and I gave him a new NovoLog regimen. -At this visit, sugars are still extremely variable, with higher blood sugars in the 300s.  These continue to appear at any time of the day, without pattern.  He had a sugar at 47 in the hospital during chemotherapy, none since then.  We discussed to increase the Lantus dose and also the NovoLog dose with a larger meal.  I strongly advised him to stay away from concentrated sweets, like ice cream. - I suggested to:  Patient Instructions  Please increase: - Lantus 48 units in the evening - Novolog 15 min before meals: 14-16 units  Please continue: - Metformin  with dinner: 2000 mg - Ozempic 0.5 mg weekly.  Please return in 3 months with your sugar log.  - today, HbA1c is 7.5% (reduced) - continue checking sugars at different times of the day - check 3x a day, rotating checks - advised for yearly eye exams >> he is not UTD - We will give him the flu shot today - Return to clinic in 3 mo with sugar log    2. PN -Stable, with occasional numbness and tingling, but not too bothersome.  These appear mostly when he wakes up. - He continues B12, alpha lipoic acid  3.  Obesity class 1 -He had significant weight loss after his cancer diagnosis.  This is mostly due to the diet that he was started on in the hospital. -Since he was discharged, he started to gain some weight back. -We will continue Ozempic for now  Philemon Kingdom, MD PhD [facility] Endocrinology

## 2018-03-21 NOTE — Telephone Encounter (Signed)
-----   Message from Philemon Kingdom, MD sent at 03/21/2018  8:42 AM EDT ----- C, Can you please enter this pt's HbA1c from 08/15/2017: 8.0%? Ty, C

## 2018-03-21 NOTE — Addendum Note (Signed)
Addended by: Drucilla Schmidt on: 03/21/2018 01:59 PM   Modules accepted: Orders

## 2018-04-27 DIAGNOSIS — M25761 Osteophyte, right knee: Secondary | ICD-10-CM | POA: Diagnosis not present

## 2018-04-27 DIAGNOSIS — M25461 Effusion, right knee: Secondary | ICD-10-CM | POA: Diagnosis not present

## 2018-04-27 DIAGNOSIS — Z880 Allergy status to penicillin: Secondary | ICD-10-CM | POA: Diagnosis not present

## 2018-04-27 DIAGNOSIS — M25561 Pain in right knee: Secondary | ICD-10-CM | POA: Diagnosis not present

## 2018-04-27 DIAGNOSIS — Z794 Long term (current) use of insulin: Secondary | ICD-10-CM | POA: Diagnosis not present

## 2018-04-27 DIAGNOSIS — C609 Malignant neoplasm of penis, unspecified: Secondary | ICD-10-CM | POA: Diagnosis not present

## 2018-04-27 DIAGNOSIS — E119 Type 2 diabetes mellitus without complications: Secondary | ICD-10-CM | POA: Diagnosis not present

## 2018-04-27 DIAGNOSIS — I10 Essential (primary) hypertension: Secondary | ICD-10-CM | POA: Diagnosis not present

## 2018-04-27 DIAGNOSIS — M11261 Other chondrocalcinosis, right knee: Secondary | ICD-10-CM | POA: Diagnosis not present

## 2018-04-27 DIAGNOSIS — Z79899 Other long term (current) drug therapy: Secondary | ICD-10-CM | POA: Diagnosis not present

## 2018-04-27 DIAGNOSIS — M1711 Unilateral primary osteoarthritis, right knee: Secondary | ICD-10-CM | POA: Diagnosis not present

## 2018-04-27 DIAGNOSIS — M1120 Other chondrocalcinosis, unspecified site: Secondary | ICD-10-CM | POA: Diagnosis not present

## 2018-05-03 DIAGNOSIS — C602 Malignant neoplasm of body of penis: Secondary | ICD-10-CM | POA: Diagnosis not present

## 2018-05-07 DIAGNOSIS — M25561 Pain in right knee: Secondary | ICD-10-CM | POA: Diagnosis not present

## 2018-05-07 DIAGNOSIS — H9312 Tinnitus, left ear: Secondary | ICD-10-CM | POA: Diagnosis not present

## 2018-05-07 DIAGNOSIS — Z79899 Other long term (current) drug therapy: Secondary | ICD-10-CM | POA: Diagnosis not present

## 2018-05-07 DIAGNOSIS — Z794 Long term (current) use of insulin: Secondary | ICD-10-CM | POA: Diagnosis not present

## 2018-05-07 DIAGNOSIS — E1142 Type 2 diabetes mellitus with diabetic polyneuropathy: Secondary | ICD-10-CM | POA: Diagnosis not present

## 2018-05-07 DIAGNOSIS — C609 Malignant neoplasm of penis, unspecified: Secondary | ICD-10-CM | POA: Diagnosis not present

## 2018-05-07 DIAGNOSIS — Z6827 Body mass index (BMI) 27.0-27.9, adult: Secondary | ICD-10-CM | POA: Diagnosis not present

## 2018-05-07 DIAGNOSIS — M7989 Other specified soft tissue disorders: Secondary | ICD-10-CM | POA: Diagnosis not present

## 2018-05-11 DIAGNOSIS — M25461 Effusion, right knee: Secondary | ICD-10-CM | POA: Diagnosis not present

## 2018-05-11 DIAGNOSIS — M1711 Unilateral primary osteoarthritis, right knee: Secondary | ICD-10-CM | POA: Diagnosis not present

## 2018-05-21 DIAGNOSIS — E119 Type 2 diabetes mellitus without complications: Secondary | ICD-10-CM | POA: Diagnosis not present

## 2018-05-21 DIAGNOSIS — Z6827 Body mass index (BMI) 27.0-27.9, adult: Secondary | ICD-10-CM | POA: Diagnosis not present

## 2018-05-21 DIAGNOSIS — C601 Malignant neoplasm of glans penis: Secondary | ICD-10-CM | POA: Diagnosis not present

## 2018-05-21 DIAGNOSIS — Z79899 Other long term (current) drug therapy: Secondary | ICD-10-CM | POA: Diagnosis not present

## 2018-05-21 DIAGNOSIS — I10 Essential (primary) hypertension: Secondary | ICD-10-CM | POA: Diagnosis not present

## 2018-05-21 DIAGNOSIS — C609 Malignant neoplasm of penis, unspecified: Secondary | ICD-10-CM | POA: Diagnosis not present

## 2018-05-21 DIAGNOSIS — Z880 Allergy status to penicillin: Secondary | ICD-10-CM | POA: Diagnosis not present

## 2018-07-12 ENCOUNTER — Other Ambulatory Visit: Payer: Self-pay

## 2018-07-12 MED ORDER — NOVOLOG FLEXPEN 100 UNIT/ML ~~LOC~~ SOPN: 14.0000 [IU] | PEN_INJECTOR | Freq: Three times a day (TID) | SUBCUTANEOUS | 11 refills | Status: DC

## 2018-07-26 DIAGNOSIS — Z6824 Body mass index (BMI) 24.0-24.9, adult: Secondary | ICD-10-CM | POA: Diagnosis not present

## 2018-07-26 DIAGNOSIS — C609 Malignant neoplasm of penis, unspecified: Secondary | ICD-10-CM | POA: Diagnosis not present

## 2018-07-27 DIAGNOSIS — R590 Localized enlarged lymph nodes: Secondary | ICD-10-CM | POA: Diagnosis not present

## 2018-07-27 DIAGNOSIS — R599 Enlarged lymph nodes, unspecified: Secondary | ICD-10-CM | POA: Diagnosis not present

## 2018-07-27 DIAGNOSIS — R9389 Abnormal findings on diagnostic imaging of other specified body structures: Secondary | ICD-10-CM | POA: Diagnosis not present

## 2018-07-27 DIAGNOSIS — C609 Malignant neoplasm of penis, unspecified: Secondary | ICD-10-CM | POA: Diagnosis not present

## 2018-07-27 DIAGNOSIS — M799 Soft tissue disorder, unspecified: Secondary | ICD-10-CM | POA: Diagnosis not present

## 2018-08-13 DIAGNOSIS — C609 Malignant neoplasm of penis, unspecified: Secondary | ICD-10-CM | POA: Diagnosis not present

## 2018-08-28 ENCOUNTER — Other Ambulatory Visit: Payer: Self-pay | Admitting: Internal Medicine

## 2018-09-07 ENCOUNTER — Other Ambulatory Visit: Payer: Self-pay

## 2018-09-07 MED ORDER — METFORMIN HCL 1000 MG PO TABS: ORAL_TABLET | ORAL | 2 refills | Status: DC

## 2018-10-03 DIAGNOSIS — Z794 Long term (current) use of insulin: Secondary | ICD-10-CM | POA: Diagnosis not present

## 2018-10-03 DIAGNOSIS — Z880 Allergy status to penicillin: Secondary | ICD-10-CM | POA: Diagnosis not present

## 2018-10-03 DIAGNOSIS — I10 Essential (primary) hypertension: Secondary | ICD-10-CM | POA: Diagnosis not present

## 2018-10-03 DIAGNOSIS — Z79899 Other long term (current) drug therapy: Secondary | ICD-10-CM | POA: Diagnosis not present

## 2018-10-03 DIAGNOSIS — C609 Malignant neoplasm of penis, unspecified: Secondary | ICD-10-CM | POA: Diagnosis not present

## 2018-10-03 DIAGNOSIS — E119 Type 2 diabetes mellitus without complications: Secondary | ICD-10-CM | POA: Diagnosis not present

## 2018-10-04 DIAGNOSIS — C601 Malignant neoplasm of glans penis: Secondary | ICD-10-CM | POA: Diagnosis not present

## 2018-10-05 DIAGNOSIS — C601 Malignant neoplasm of glans penis: Secondary | ICD-10-CM | POA: Diagnosis not present

## 2018-10-05 DIAGNOSIS — I10 Essential (primary) hypertension: Secondary | ICD-10-CM | POA: Diagnosis not present

## 2018-10-05 DIAGNOSIS — C609 Malignant neoplasm of penis, unspecified: Secondary | ICD-10-CM | POA: Diagnosis not present

## 2018-10-05 DIAGNOSIS — E119 Type 2 diabetes mellitus without complications: Secondary | ICD-10-CM | POA: Diagnosis not present

## 2018-10-05 DIAGNOSIS — Z794 Long term (current) use of insulin: Secondary | ICD-10-CM | POA: Diagnosis not present

## 2018-10-09 DIAGNOSIS — C609 Malignant neoplasm of penis, unspecified: Secondary | ICD-10-CM | POA: Diagnosis not present

## 2018-10-09 DIAGNOSIS — Z6825 Body mass index (BMI) 25.0-25.9, adult: Secondary | ICD-10-CM | POA: Diagnosis not present

## 2018-10-09 DIAGNOSIS — Z4889 Encounter for other specified surgical aftercare: Secondary | ICD-10-CM | POA: Diagnosis not present

## 2018-10-12 DIAGNOSIS — C609 Malignant neoplasm of penis, unspecified: Secondary | ICD-10-CM | POA: Diagnosis not present

## 2018-10-12 DIAGNOSIS — E1142 Type 2 diabetes mellitus with diabetic polyneuropathy: Secondary | ICD-10-CM | POA: Diagnosis not present

## 2018-10-12 DIAGNOSIS — N4889 Other specified disorders of penis: Secondary | ICD-10-CM | POA: Diagnosis not present

## 2018-10-12 DIAGNOSIS — E785 Hyperlipidemia, unspecified: Secondary | ICD-10-CM | POA: Diagnosis not present

## 2018-10-12 DIAGNOSIS — Z794 Long term (current) use of insulin: Secondary | ICD-10-CM | POA: Diagnosis not present

## 2018-10-12 DIAGNOSIS — N181 Chronic kidney disease, stage 1: Secondary | ICD-10-CM | POA: Diagnosis not present

## 2018-10-12 DIAGNOSIS — Z8549 Personal history of malignant neoplasm of other male genital organs: Secondary | ICD-10-CM | POA: Diagnosis not present

## 2018-10-12 DIAGNOSIS — C601 Malignant neoplasm of glans penis: Secondary | ICD-10-CM | POA: Diagnosis not present

## 2018-10-12 DIAGNOSIS — Z9889 Other specified postprocedural states: Secondary | ICD-10-CM | POA: Diagnosis not present

## 2018-10-12 DIAGNOSIS — E1122 Type 2 diabetes mellitus with diabetic chronic kidney disease: Secondary | ICD-10-CM | POA: Diagnosis not present

## 2018-10-12 DIAGNOSIS — Z880 Allergy status to penicillin: Secondary | ICD-10-CM | POA: Diagnosis not present

## 2018-10-12 DIAGNOSIS — I129 Hypertensive chronic kidney disease with stage 1 through stage 4 chronic kidney disease, or unspecified chronic kidney disease: Secondary | ICD-10-CM | POA: Diagnosis not present

## 2018-10-12 DIAGNOSIS — Z79899 Other long term (current) drug therapy: Secondary | ICD-10-CM | POA: Diagnosis not present

## 2018-10-17 DIAGNOSIS — C609 Malignant neoplasm of penis, unspecified: Secondary | ICD-10-CM | POA: Diagnosis not present

## 2018-10-17 DIAGNOSIS — Z09 Encounter for follow-up examination after completed treatment for conditions other than malignant neoplasm: Secondary | ICD-10-CM | POA: Diagnosis not present

## 2018-11-15 DIAGNOSIS — C609 Malignant neoplasm of penis, unspecified: Secondary | ICD-10-CM | POA: Diagnosis not present

## 2018-11-15 DIAGNOSIS — R590 Localized enlarged lymph nodes: Secondary | ICD-10-CM | POA: Diagnosis not present

## 2018-11-15 DIAGNOSIS — N1330 Unspecified hydronephrosis: Secondary | ICD-10-CM | POA: Diagnosis not present

## 2019-01-09 DIAGNOSIS — C602 Malignant neoplasm of body of penis: Secondary | ICD-10-CM | POA: Diagnosis not present

## 2019-01-14 DIAGNOSIS — C774 Secondary and unspecified malignant neoplasm of inguinal and lower limb lymph nodes: Secondary | ICD-10-CM | POA: Diagnosis not present

## 2019-01-14 DIAGNOSIS — C609 Malignant neoplasm of penis, unspecified: Secondary | ICD-10-CM | POA: Diagnosis not present

## 2019-03-22 ENCOUNTER — Ambulatory Visit (INDEPENDENT_AMBULATORY_CARE_PROVIDER_SITE_OTHER): Payer: BLUE CROSS/BLUE SHIELD | Admitting: Internal Medicine

## 2019-03-22 DIAGNOSIS — E1142 Type 2 diabetes mellitus with diabetic polyneuropathy: Secondary | ICD-10-CM

## 2019-03-22 DIAGNOSIS — Z6831 Body mass index (BMI) 31.0-31.9, adult: Secondary | ICD-10-CM

## 2019-03-22 DIAGNOSIS — E1165 Type 2 diabetes mellitus with hyperglycemia: Secondary | ICD-10-CM

## 2019-03-22 DIAGNOSIS — E6609 Other obesity due to excess calories: Secondary | ICD-10-CM

## 2019-03-22 NOTE — Progress Notes (Signed)
NO SHOW

## 2019-03-26 ENCOUNTER — Other Ambulatory Visit: Payer: Self-pay

## 2019-03-28 ENCOUNTER — Encounter: Payer: Self-pay | Admitting: Internal Medicine

## 2019-03-28 ENCOUNTER — Ambulatory Visit (INDEPENDENT_AMBULATORY_CARE_PROVIDER_SITE_OTHER): Payer: BC Managed Care – PPO | Admitting: Internal Medicine

## 2019-03-28 ENCOUNTER — Other Ambulatory Visit: Payer: Self-pay

## 2019-03-28 VITALS — BP 140/62 | HR 60 | Ht 71.0 in | Wt 182.0 lb

## 2019-03-28 DIAGNOSIS — E1165 Type 2 diabetes mellitus with hyperglycemia: Secondary | ICD-10-CM

## 2019-03-28 DIAGNOSIS — E785 Hyperlipidemia, unspecified: Secondary | ICD-10-CM | POA: Insufficient documentation

## 2019-03-28 DIAGNOSIS — Z6831 Body mass index (BMI) 31.0-31.9, adult: Secondary | ICD-10-CM

## 2019-03-28 DIAGNOSIS — IMO0002 Reserved for concepts with insufficient information to code with codable children: Secondary | ICD-10-CM

## 2019-03-28 DIAGNOSIS — E6609 Other obesity due to excess calories: Secondary | ICD-10-CM | POA: Diagnosis not present

## 2019-03-28 DIAGNOSIS — E1142 Type 2 diabetes mellitus with diabetic polyneuropathy: Secondary | ICD-10-CM

## 2019-03-28 LAB — POCT GLYCOSYLATED HEMOGLOBIN (HGB A1C): Hemoglobin A1C: 11 % — AB (ref 4.0–5.6)

## 2019-03-28 NOTE — Patient Instructions (Addendum)
Please move: - Metformin 2000 mg with dinner  Please continue: - Ozempic 0.5 mg weekly - Lantus 48 units in the evening - Novolog 15 min before meals: 14-16 units  STOP ICECREAM!  Please return in 3 months with your sugar log.

## 2019-03-28 NOTE — Progress Notes (Signed)
Patient ID: Elijah Chen, male   DOB: Aug 21, 1945, 73 y.o.   MRN: [id number]  HPI: EMERIK Chen is a 73 y.o.-year-old male, returning for f/u for DM2, dx in 2006, insulin-dependent since dx, uncontrolled, with complications (peripheral neuropathy). He moved here from [place], where he was followed by endocrinology. Last visit 1 year ago.  He is here with his daughter who offers part of the history.  He now lives with her and she is starting to help him with his medications.  She offers part of the history, including regarding medications, past medical history and dietary habits.  He has a history of penile cancer.  He had chemotherapy, finished before last visit.  He lost 20 pounds after a cancer diagnosis.  Sugars improved.  Reviewed his HbA1c levels: Lab Results  Component Value Date   HGBA1C 7.5 (A) 03/21/2018   HGBA1C 8.0 (A) 08/15/2017   HGBA1C 9.5 05/15/2017  03/07/2018: HbA1c 7.8%. 05/11/2017: HbA1c 9.8% 04/2015: HbA1c 13% 2015: Hba1c 7%  Pt is on: - Metformin 2000 mg with dinner - Ozempic 0.5 mg weekly - Lantus 48 units in the evening - Novolog 15 min before meals: 14-16 units  Pt checks his sugars seldom: - am: 188-250 >> 88-180, 190, 224 >> 175-230 >> 112-376 - 2h after b'fast:  150s >> 252 >> n/c >> 137-301 >> n/c - before lunch: 150-190 >> n/c >> 94-164 >> 304, 334 >> n/c - 2h after lunch: 78, 158-287 >> n/c >> 92, 137 >> n/c - before dinner: 130s >> n/c >>  82, 89-182 >> 93, 231-385 >> n/c - 2h after dinner:n/c >> 53, 54, 64, 67, 113-185 >> 92-263 >> n/c - bedtime:140-150s >> n/c >> 70, 126-165 >> n/c - nighttime: n/c Lowest sugar was 53 >> 47 (in hospital, during ChTx) >> 50 - not recently. He has hypoglycemia awareness in the 70s. Highest sugar was  224 >> 385 >> 376.  Pt's meals are: - Breakfast: oatmeal + peaches  - Lunch: sandwich - Dinner: soups, meat + veggies or salad - Snacks: occasional sweets (icecream) No sodas.  -+ CKD. Reviewed labs:  01/09/2019:  Glu 242 BUN/creatinine 28/0.96, GFR 79 03/19/2018: Glu 160, BUN/creatinine 15/0.84, GFR 87 05/11/2017: Glu 186, BUN/creatinine 15/0.92, GFR 81 05/03/2016: 17/1.06 Lab Results  Component Value Date   BUN 16 09/08/2017   CREATININE 1.00 09/08/2017  05/21/2015: ACR 3.36  -+ HL: last Lipid panel: 05/11/2017: 150/132/39/85 05/03/2016: 175/142/46/100 No results found for: CHOL, HDL, LDLCALC, LDLDIRECT, TRIG, CHOLHDL  Off pravastatin.  - last eye exam 09/2015: No DR.  -+ Mild numbness and tingling in his feet.  On alpha-lipoic acid and B12 vitamin.  Other labs from PCP - drawn 05/11/2017: TSH 1.64 B12 432  ROS: Constitutional: no weight gain/+ weight loss, no fatigue, no subjective hyperthermia, no subjective hypothermia Eyes: no blurry vision, no xerophthalmia ENT: no sore throat, no nodules palpated in neck, no dysphagia, no odynophagia, no hoarseness Cardiovascular: no CP/no SOB/no palpitations/no leg swelling Respiratory: no cough/no SOB/no wheezing Gastrointestinal: no N/no V/no D/no C/no acid reflux Musculoskeletal: no muscle aches/no joint aches Skin: no rashes, no hair loss Neurological: no tremors/no numbness/no tingling/no dizziness  I reviewed pt's medications, allergies, PMH, social hx, family hx, and changes were documented in the history of present illness. Otherwise, unchanged from my initial visit note.  Patient Active Problem List   Diagnosis Date Noted  . Peripheral sensory neuropathy due to type 2 diabetes mellitus ([place]) 11/30/2015    Priority: High  .  Uncontrolled type 2 diabetes mellitus with peripheral neuropathy ([place]) 09/04/2015    Priority: High  . Class 1 obesity due to excess calories with serious comorbidity and body mass index (BMI) of 31.0 to 31.9 in adult 08/15/2017    Priority: Medium  . 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 Marital Status: Married    Spouse Name: N/A   Social History Main Topics  . Smoking  status: Never Smoker   . Smokeless tobacco: Not on file  . Alcohol Use: No  . Drug Use: No   Current Outpatient Medications on File Prior to Visit  Medication Sig Dispense Refill  . ALPHA-LIPOIC ACID PO Take 2 capsules by mouth 2 (two) times daily.    . Ascorbic Acid (VITA-C PO) Take 2 tablets by mouth every morning. Power C    . aspirin EC 325 MG tablet Take 325 mg by mouth every 6 (six) hours as needed.    . Coenzyme Q10 (COQ-10) 100 MG CAPS Take 2 capsules by mouth every morning.    Marland Kitchen glucose blood (BAYER CONTOUR TEST) test strip 1 each by Other route daily. Use as instructed DX E11.65, Z79.4 100 each 1  . Insulin Pen Needle (PEN NEEDLES 31GX5/16") 31G X 8 MM MISC Use needles 4 times a day as directed. DX E11.65, Z79.4 200 each 11  . LANTUS SOLOSTAR 100 UNIT/ML Solostar Pen Inject 48 Units into the skin daily after supper. 15 mL 5  . lisinopril-hydrochlorothiazide (PRINZIDE,ZESTORETIC) 20-12.5 MG tablet Take 1 tablet by mouth every morning.   0  . metFORMIN (GLUCOPHAGE) 1000 MG tablet TAKE 1 TABLET BY MOUTH TWICE DAILY WITH A MEAL 180 tablet 2  . Misc Natural Products (APPLE CIDER VINEGAR) TABS Take 2 tablets by mouth 2 (two) times daily.     Marland Kitchen NOVOLOG FLEXPEN 100 UNIT/ML FlexPen Inject 14-16 Units into the skin 3 (three) times daily with meals. 15 mL 11  . OZEMPIC, 0.25 OR 0.5 MG/DOSE, 2 MG/1.5ML SOPN INJECT 0.5MG  INTO THE SKIN ONCE A WEEK INTO THE SKIN 4 mL 2  . pravastatin (PRAVACHOL) 80 MG tablet Take 80 mg by mouth every morning.   0   No current facility-administered medications on file prior to visit.     Allergies  Allergen Reactions  . Penicillins Hives   No family history of diabetes.  PE: BP 140/62   Pulse 60   Ht 5\' 11"  (1.803 m)   Wt 182 lb (82.6 kg)   SpO2 96%   BMI 25.38 kg/m  Body mass index is 25.38 kg/m. Wt Readings from Last 3 Encounters:  03/28/19 182 lb (82.6 kg)  03/21/18 185 lb 9.6 oz (84.2 kg)  11/28/17 194 lb 14.4 oz (88.4 kg)   Constitutional:  overweight, in NAD Eyes: PERRLA, EOMI, no exophthalmos ENT: moist mucous membranes, no thyromegaly, no cervical lymphadenopathy Cardiovascular: RRR, No RG, + 1/6 SEM Respiratory: CTA B Gastrointestinal: abdomen soft, NT, ND, BS+ Musculoskeletal: no deformities, strength intact in all 4 Skin: moist, warm, no rashes Neurological: no tremor with outstretched hands, DTR normal in all 4  ASSESSMENT: 1. 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  Obesity class 1  4.  Hyperlipidemia  PLAN:  1. Patient with longstanding, uncontrolled, type 2 diabetes, on basal-bolus insulin regimen and also metformin + weekly GLP-1 receptor agonist.  He is not usually compliant with medications and visits.  At last visit, sugars  are very variable, with higher blood sugars in the 300s.  These were apparently without any pattern, at anytime of the day.  He also had a low blood sugar, at 47, when he was in the hospital during chemotherapy.  No low blood sugars at home.  At last visit, we increased his Lantus dose and also the NovoLog dose with a larger meal.  I also advised him to try to avoid concentrated sweets, like ice cream. -We reviewed together latest HbA1c from 1 year ago which was lower, at 7.5%. Today: 11% (MUCH higher). -At this visit, I discussed with patient and his daughter that he absolutely needs to start checking sugars consistently.  I gave him a sugar log and advised him when to check sugars and when to take his medicines.  He is missing medication doses now.  He is also having dietary indiscretions and having ice cream every night.  I strongly advised him to stop this.  For now, it is difficult to change his medication regimen but I advised him to start checking sugars consistently and take the medications as prescribed (I have reviewed when he has to take each of them) and his daughter will help him with this.  I advised him to let me know  if the sugars do not improve within the next month. -Since he is not taking the entire metformin dose at night, I advised him to do so to hopefully improve his sugars in the morning more. - I suggested to:  Patient Instructions  Please move: - Metformin 2000 mg with dinner  Please continue: - Ozempic 0.5 mg weekly - Lantus 48 units in the evening - Novolog 15 min before meals: 14-16 units  STOP ICECREAM!  Please return in 3 months with your sugar log.  - advised to check sugars at different times of the day - 3x a day, rotating check times - advised for yearly eye exams >> he is not UTD - return to clinic in 3 months    2. PN -Stable, with occasional numbness and tingling, but not too bothersome -Previously on B12 and alpha-lipoic acid  3.  Obesity class 1 -He had significant weight loss after he was diagnosed with cancer and as he improved his diet afterwards.  He lost 20 pounds before last visit, but only 3 pounds in the last year -We will continue Ozempic, which should also help with weight loss.  We also discussed about improving diet.  4. HL - Reviewed latest lipid panel from 2018: Fractions were at goal but at that time he was taking pravastatin -He mentions that he was taken off pravastatin as the cholesterol level was too low... -We will recheck today and most likely will need to restart pravastatin  Component     Latest Ref Rng & Units 03/28/2019  Cholesterol     0 - 200 mg/dL 206 (H)  Triglycerides     0.0 - 149.0 mg/dL 166.0 (H)  HDL Cholesterol     >39.00 mg/dL 49.30  VLDL     0.0 - 40.0 mg/dL 33.2  LDL (calc)     0 - 99 mg/dL 123 (H)  Total CHOL/HDL Ratio      4  NonHDL      156.38   Lipid panel is abnormal: LDL higher.  Will send a new prescription for pravastatin to his pharmacy.  He was previously on 80 mg daily but I will send a prescription for 40 mg to start with.  Salena Saner  Cruzita Lederer, MD PhD [facility]

## 2019-03-29 ENCOUNTER — Encounter: Payer: Self-pay | Admitting: Internal Medicine

## 2019-03-29 ENCOUNTER — Telehealth: Payer: Self-pay

## 2019-03-29 LAB — LIPID PANEL
Cholesterol: 206 mg/dL — ABNORMAL HIGH (ref 0–200)
HDL: 49.3 mg/dL (ref >39.00)
LDL Cholesterol: 123 mg/dL — ABNORMAL HIGH (ref 0–99)
NonHDL: 156.38
Total CHOL/HDL Ratio: 4
Triglycerides: 166 mg/dL — ABNORMAL HIGH (ref 0.0–149.0)
VLDL: 33.2 mg/dL (ref 0.0–40.0)

## 2019-03-29 MED ORDER — PRAVASTATIN SODIUM 40 MG PO TABS: 40.0000 mg | ORAL_TABLET | Freq: Every morning | ORAL | 3 refills | Status: DC

## 2019-03-29 NOTE — Telephone Encounter (Signed)
Left message for patient to return our call at [phone].  

## 2019-03-29 NOTE — Telephone Encounter (Signed)
-----   Message from Philemon Kingdom, MD sent at 03/29/2019  2:01 PM EDT ----- Lenna Sciara, can you please call pt: His bad cholesterol appears higher than before.  I sent a new prescription for pravastatin to his pharmacy.  He was previously on 80 mg daily but I will send a prescription for 40 mg to start with.

## 2019-04-01 NOTE — Telephone Encounter (Signed)
Notified patient of message from Dr. Gherghe, patient expressed understanding and agreement. No further questions.  

## 2019-04-01 NOTE — Telephone Encounter (Signed)
Left message for patient to return our call at [phone].  

## 2019-04-22 DIAGNOSIS — C799 Secondary malignant neoplasm of unspecified site: Secondary | ICD-10-CM | POA: Diagnosis not present

## 2019-04-22 DIAGNOSIS — C609 Malignant neoplasm of penis, unspecified: Secondary | ICD-10-CM | POA: Diagnosis not present

## 2019-05-01 ENCOUNTER — Other Ambulatory Visit: Payer: Self-pay | Admitting: Internal Medicine

## 2019-06-26 ENCOUNTER — Other Ambulatory Visit: Payer: Self-pay

## 2019-06-27 ENCOUNTER — Ambulatory Visit: Payer: BC Managed Care – PPO | Admitting: Internal Medicine

## 2019-07-15 ENCOUNTER — Ambulatory Visit: Payer: BC Managed Care – PPO | Admitting: Internal Medicine

## 2019-08-23 ENCOUNTER — Other Ambulatory Visit: Payer: Self-pay | Admitting: Internal Medicine

## 2019-08-26 DIAGNOSIS — C609 Malignant neoplasm of penis, unspecified: Secondary | ICD-10-CM | POA: Diagnosis not present

## 2019-08-29 DIAGNOSIS — R918 Other nonspecific abnormal finding of lung field: Secondary | ICD-10-CM | POA: Diagnosis not present

## 2019-08-29 DIAGNOSIS — C609 Malignant neoplasm of penis, unspecified: Secondary | ICD-10-CM | POA: Diagnosis not present

## 2019-09-02 DIAGNOSIS — C609 Malignant neoplasm of penis, unspecified: Secondary | ICD-10-CM | POA: Diagnosis not present

## 2019-12-05 ENCOUNTER — Other Ambulatory Visit: Payer: Self-pay | Admitting: Internal Medicine

## 2019-12-06 ENCOUNTER — Other Ambulatory Visit: Payer: Self-pay | Admitting: Internal Medicine

## 2019-12-30 DIAGNOSIS — Z8549 Personal history of malignant neoplasm of other male genital organs: Secondary | ICD-10-CM | POA: Diagnosis not present

## 2019-12-30 DIAGNOSIS — C609 Malignant neoplasm of penis, unspecified: Secondary | ICD-10-CM | POA: Diagnosis not present

## 2020-05-17 ENCOUNTER — Other Ambulatory Visit: Payer: Self-pay | Admitting: Internal Medicine

## 2020-07-23 ENCOUNTER — Other Ambulatory Visit: Payer: Self-pay | Admitting: Internal Medicine

## 2020-07-24 NOTE — Telephone Encounter (Signed)
Last ov 03/28/2019 Please advise

## 2020-08-03 ENCOUNTER — Other Ambulatory Visit: Payer: Self-pay | Admitting: Internal Medicine

## 2020-08-03 ENCOUNTER — Telehealth: Payer: Self-pay | Admitting: Internal Medicine

## 2020-08-03 ENCOUNTER — Other Ambulatory Visit: Payer: Self-pay

## 2020-08-03 NOTE — Telephone Encounter (Signed)
Pt's wife called to request a refill on LANTUS SOLOSTAR 100 UNIT/ML Solostar Pen for pt. If you have any questions please call wife at [phone].   PHARMACY:  [facility] [address], [place] - [address] Phone:  [phone]  Fax:  [phone]

## 2020-08-03 NOTE — Telephone Encounter (Signed)
Last OV 03/2019

## 2020-08-04 NOTE — Telephone Encounter (Signed)
He was lost for f/u for me - refills per PCP

## 2020-08-05 ENCOUNTER — Ambulatory Visit: Payer: BC Managed Care – PPO | Admitting: Internal Medicine

## 2020-08-05 ENCOUNTER — Other Ambulatory Visit: Payer: Self-pay

## 2020-08-05 ENCOUNTER — Encounter: Payer: Self-pay | Admitting: Internal Medicine

## 2020-08-05 VITALS — BP 120/78 | HR 56 | Ht 71.0 in | Wt 197.4 lb

## 2020-08-05 DIAGNOSIS — E663 Overweight: Secondary | ICD-10-CM | POA: Diagnosis not present

## 2020-08-05 DIAGNOSIS — E1142 Type 2 diabetes mellitus with diabetic polyneuropathy: Secondary | ICD-10-CM | POA: Diagnosis not present

## 2020-08-05 DIAGNOSIS — E785 Hyperlipidemia, unspecified: Secondary | ICD-10-CM

## 2020-08-05 DIAGNOSIS — E1165 Type 2 diabetes mellitus with hyperglycemia: Secondary | ICD-10-CM | POA: Diagnosis not present

## 2020-08-05 DIAGNOSIS — IMO0002 Reserved for concepts with insufficient information to code with codable children: Secondary | ICD-10-CM

## 2020-08-05 LAB — POCT GLYCOSYLATED HEMOGLOBIN (HGB A1C): Hemoglobin A1C: 9.6 % — AB (ref 4.0–5.6)

## 2020-08-05 MED ORDER — PRAVASTATIN SODIUM 40 MG PO TABS: 40.0000 mg | ORAL_TABLET | Freq: Every morning | ORAL | 3 refills | Status: AC

## 2020-08-05 MED ORDER — LANTUS SOLOSTAR 100 UNIT/ML ~~LOC~~ SOPN: 20.0000 [IU] | PEN_INJECTOR | Freq: Every day | SUBCUTANEOUS | 1 refills | Status: DC

## 2020-08-05 MED ORDER — OZEMPIC (0.25 OR 0.5 MG/DOSE) 2 MG/1.5ML ~~LOC~~ SOPN: PEN_INJECTOR | SUBCUTANEOUS | 1 refills | Status: AC

## 2020-08-05 NOTE — Telephone Encounter (Signed)
Pt came in for OV. Refills sent to preferred pharmacy.

## 2020-08-05 NOTE — Addendum Note (Signed)
Addended by: Lauralyn Primes on: 08/05/2020 11:13 AM   Modules accepted: Orders

## 2020-08-05 NOTE — Patient Instructions (Signed)
Please restart: - Lantus 20 units at bedtime - Ozempic 0.5 mg weekly  Decrease: - Novolog 10-14 units 15 min before meals  Please return in 3 months with your sugar log.

## 2020-08-05 NOTE — Progress Notes (Signed)
Patient ID: Elijah Chen, male   DOB: Dec 30, 1945, 75 y.o.   MRN: [id number]  This visit occurred during the SARS-CoV-2 public health emergency.  Safety protocols were in place, including screening questions prior to the visit, additional usage of staff PPE, and extensive cleaning of exam room while observing appropriate contact time as indicated for disinfecting solutions.   HPI: Elijah Chen is a 75 y.o.-year-old male, returning for f/u for DM2, dx in 2006, insulin-dependent since dx, uncontrolled, with complications (peripheral neuropathy). He moved here from [place], where he was followed by endocrinology. Last visit 1 year and 4 months ago.  Previous visit was 1 year prior.  He is here with his wife offers part of the history especially related to his interim past medical history since our last visit, medications and blood sugars.  He has a history of penile cancer.  He had chemotherapy.  He lost 20 pounds after a cancer diagnosis.  Sugars improved, but they were sending him before last visit.  Reviewed HbA1c levels: Lab Results  Component Value Date   HGBA1C 11.0 (A) 03/28/2019   HGBA1C 7.5 (A) 03/21/2018   HGBA1C 8.0 (A) 08/15/2017  03/07/2018: HbA1c 7.8%. 05/11/2017: HbA1c 9.8% 04/2015: HbA1c 13% 2015: Hba1c 7%  Pt was on: - Metformin 2000 mg with dinner - Ozempic 0.5 mg weekly - Lantus 48 units in the evening - Novolog 15 min before meals: 14 to 16 units  At today's visit, he is only on: -NovoLog 16 units before meals As he ran out all of his other medications.  Pt checks his sugars seldom: - am: 188-250 >> 88-180, 190, 224 >> 175-230 >> 112-376 >> 184-238 - 2h after b'fast:  150s >> 252 >> n/c >> 137-301 >> n/c >> 270 - before lunch: 150-190 >> n/c >> 94-164 >> 304, 334 >> n/c >> 170, 176, 191 - 2h after lunch: 78, 158-287 >> n/c >> 92, 137 >> n/c  - before dinner: n/c >>  82, 89-182 >> 93, 231-385 >> n/c >> 74 - 2h after dinner: 53, 54, 64, 67, 113-185 >> 92-263 >> n/c  >> 100, 162 - bedtime:140-150s >> n/c >> 70, 126-165 >> n/c - nighttime: n/c Lowest sugar was 53 >> 47 (in hospital, during ChTx) >> 50 >> 74. He has hypoglycemia awareness in the 70s. Highest sugar was  224 >> 385 >> 376 >> 238.  Pt's meals are: - Breakfast: oatmeal + peaches  - Lunch: sandwich - Dinner: soups, meat + veggies or salad - Snacks: occasional sweets (icecream)  -+ CKD: 12/30/2019: Glu 164, BUN/creatinine 17/0.9, GFR 84 01/09/2019: Glu 242 BUN/creatinine 28/0.96, GFR 79 03/19/2018: Glu 160, BUN/creatinine 15/0.84, GFR 87 Lab Results  Component Value Date   BUN 16 09/08/2017   CREATININE 1.00 09/08/2017  05/11/2017: Glu 186, BUN/creatinine 15/0.92, GFR 81 05/03/2016: 17/1.06 05/21/2015: ACR 3.36 Off Lisinopril.  -+ HL: last Lipid panel: Lab Results  Component Value Date   CHOL 206 (H) 03/28/2019   HDL 49.30 03/28/2019   LDLCALC 123 (H) 03/28/2019   TRIG 166.0 (H) 03/28/2019   CHOLHDL 4 03/28/2019  05/11/2017: 150/132/39/85 05/03/2016: 175/142/46/100 Previously on pravastatin 40, now off.  - last eye exam 01/2018: No DR  - + mild numbness and tingling in his feet.  He takes Bempedoic acid and B12 vitamin  Other labs from PCP - drawn 05/11/2017: TSH 1.64 B12 432  ROS: Constitutional: no weight gain/no weight loss, no fatigue, no subjective hyperthermia, no subjective hypothermia Eyes: no blurry vision,  no xerophthalmia ENT: no sore throat, no nodules palpated in neck, no dysphagia, no odynophagia, no hoarseness Cardiovascular: no CP/no SOB/no palpitations/no leg swelling Respiratory: no cough/no SOB/no wheezing Gastrointestinal: no N/no V/no D/no C/no acid reflux Musculoskeletal: no muscle aches/no joint aches Skin: no rashes, no hair loss Neurological: no tremors/no numbness/no tingling/no dizziness  I reviewed pt's medications, allergies, PMH, social hx, family hx, and changes were documented in the history of present illness. Otherwise, unchanged from  my initial visit note.  Patient Active Problem List   Diagnosis Date Noted  . Peripheral sensory neuropathy due to type 2 diabetes mellitus ([place]) 11/30/2015    Priority: High  . Uncontrolled type 2 diabetes mellitus with peripheral neuropathy ([place]) 09/04/2015    Priority: High  . Hyperlipidemia 03/28/2019    Priority: Medium  . Class 1 obesity due to excess calories with serious comorbidity and body mass index (BMI) of 31.0 to 31.9 in adult 08/15/2017    Priority: Medium  . 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 Marital Status: Married    Spouse Name: N/A   Social History Main Topics  . Smoking status: Never Smoker   . Smokeless tobacco: Not on file  . Alcohol Use: No  . Drug Use: No   Current Outpatient Medications on File Prior to Visit  Medication Sig Dispense Refill  . ALPHA-LIPOIC ACID PO Take 2 capsules by mouth 2 (two) times daily.    . Ascorbic Acid (VITA-C PO) Take 2 tablets by mouth every morning. Power C    . aspirin EC 325 MG tablet Take 325 mg by mouth every 6 (six) hours as needed.    . Coenzyme Q10 (COQ-10) 100 MG CAPS Take 2 capsules by mouth every morning.    Marland Kitchen glucose blood (BAYER CONTOUR TEST) test strip 1 each by Other route daily. Use as instructed DX E11.65, Z79.4 100 each 1  . Insulin Pen Needle (PEN NEEDLES 31GX5/16") 31G X 8 MM MISC Use needles 4 times a day as directed. DX E11.65, Z79.4 200 each 11  . LANTUS SOLOSTAR 100 UNIT/ML Solostar Pen INJECT 45 UNITS SUBCUTANEOUSLY AT BEDTIME 15 mL 0  . lisinopril-hydrochlorothiazide (PRINZIDE,ZESTORETIC) 20-12.5 MG tablet Take 1 tablet by mouth every morning.   0  . metFORMIN (GLUCOPHAGE) 1000 MG tablet TAKE 1 TABLET BY MOUTH TWICE DAILY WITH A MEAL 180 tablet 2  . Misc Natural Products (APPLE CIDER VINEGAR) TABS Take 2 tablets by mouth 2 (two) times daily.     Marland Kitchen NOVOLOG FLEXPEN 100 UNIT/ML FlexPen Inject 14-16 Units into the skin 3 (three) times daily with meals. APPOINTMENT  NEEDED FOR FURTHER REFILLS 43.2 mL 0  . OZEMPIC, 0.25 OR 0.5 MG/DOSE, 2 MG/1.5ML SOPN INJECT 0.5MG  INTO THE SKIN ONCE A WEEK INTO THE SKIN 4 mL 2  . pravastatin (PRAVACHOL) 40 MG tablet Take 1 tablet (40 mg total) by mouth every morning. 90 tablet 3   No current facility-administered medications on file prior to visit.    Allergies  Allergen Reactions  . Penicillins Hives   No family history of diabetes.  PE: BP 120/78   Pulse (!) 56   Ht 5\' 11"  (1.803 m)   Wt 197 lb 6.4 oz (89.5 kg)   SpO2 95%   BMI 27.53 kg/m  Body mass index is 27.53 kg/m. Wt Readings from Last 3 Encounters:  08/05/20 197 lb 6.4 oz (89.5 kg)  03/28/19 182 lb (82.6 kg)  03/21/18 185 lb  9.6 oz (84.2 kg)   Constitutional: overweight, in NAD Eyes: PERRLA, EOMI, no exophthalmos ENT: moist mucous membranes, no thyromegaly, no cervical lymphadenopathy Cardiovascular: RRR, No RG, +1/6 SEM Respiratory: CTA B Gastrointestinal: abdomen soft, NT, ND, BS+ Musculoskeletal: no deformities, strength intact in all 4 Skin: moist, warm, no rashes Neurological: no tremor with outstretched hands, DTR normal in all 4  ASSESSMENT: 1. DM2, insulin-dependent, uncontrolled, with complications - Peripheral neuropathy  2. Overweight  3.  Hyperlipidemia  PLAN:  1. Patient with longstanding, uncontrolled, type 2 diabetes, on basal-bolus insulin regimen, also Metformin and weekly GLP-1 receptor agonist, noncompliant with medications and visits.  He returns after 1 year and 3 months, after I refused to refill his insulin prescription.  Previous visit was a year prior.  At today's visit, we discussed that it is not coming for appointments as recommended and not following medical advice, I would not be able to continue to see him. -We reviewed together latest HbA1c which was much higher, 11%, in 03/2019.  At that time I discussed with the patient and his daughter that he absolutely needed to start checking sugars consistently, and I  gave him a sugar log.  He was missing medication doses and we discussed about the importance of taking this consistently.  His daughter was planning to help him with this.  I also advised him to take entire Metformin dose with dinner to improve compliance and also improve blood sugars in the morning. - at this visit, his wife tells me he is off Lisinopril, Pravastatin, Metformin, Ozempic (ran out), Lantus 25 units daily (they decreased the dose due to low blood sugars and then ran out) and only taking Novolog 16  with meals -His sugars appear to be above goal and highest in the morning and they improve later in the day, most likely due to the NovoLog dosing. - HbA1c today: 9.6% (lower) -At this visit, we will restart Lantus at a low dose, decrease NovoLog, and restart Ozempic.  For now, we will hold off Metformin, but we may need to add this back at next visit.  I did advise them that if they see low blood sugars, they will need to back off NovoLog in the morning. -Also, he and his wife started a new diet that limits starches -advised him not to go to a full ketogenic diet, but otherwise, limiting starches should definitely work improving his blood sugars - I suggested to:  Patient Instructions  Please restart: - Lantus 20 units at bedtime - Ozempic 0.5 mg weekly  Decrease: - Novolog 10-14 units 15 min before meals  Please return in 3 months with your sugar log.   - advised to check sugars at different times of the day - 3x a day, rotating check times - advised for yearly eye exams >> he is not UTD - return to clinic in 3 months    2.  Overweight -He had significant weight loss after he was diagnosed with cancer and as he improved his diet afterwards.   -he gained 15 lbs since last OV -We will restart Ozempic, which should also help with weight loss -Also, continue with his low-carb diet  3. HL - Reviewed latest lipid panel from 03/2019: LDL above goal, as are his triglycerides: Lab  Results  Component Value Date   CHOL 206 (H) 03/28/2019   HDL 49.30 03/28/2019   LDLCALC 123 (H) 03/28/2019   TRIG 166.0 (H) 03/28/2019   CHOLHDL 4 03/28/2019  - Off pravastatin  40 >> strongly advised to restart -sent a new prescription to his pharmacy - they mention he had another Lipid panel more recently >> will send this to me  Carlus Pavlov, MD PhD [facility]

## 2020-11-01 ENCOUNTER — Other Ambulatory Visit: Payer: Self-pay | Admitting: Internal Medicine

## 2020-12-01 ENCOUNTER — Encounter: Payer: Self-pay | Admitting: Internal Medicine

## 2020-12-01 ENCOUNTER — Other Ambulatory Visit: Payer: Self-pay

## 2020-12-01 ENCOUNTER — Ambulatory Visit: Payer: BC Managed Care – PPO | Admitting: Internal Medicine

## 2020-12-01 VITALS — BP 120/72 | HR 62 | Ht 71.0 in | Wt 183.4 lb

## 2020-12-01 DIAGNOSIS — E785 Hyperlipidemia, unspecified: Secondary | ICD-10-CM | POA: Diagnosis not present

## 2020-12-01 DIAGNOSIS — E1142 Type 2 diabetes mellitus with diabetic polyneuropathy: Secondary | ICD-10-CM

## 2020-12-01 DIAGNOSIS — E663 Overweight: Secondary | ICD-10-CM | POA: Diagnosis not present

## 2020-12-01 DIAGNOSIS — E1165 Type 2 diabetes mellitus with hyperglycemia: Secondary | ICD-10-CM

## 2020-12-01 DIAGNOSIS — IMO0002 Reserved for concepts with insufficient information to code with codable children: Secondary | ICD-10-CM

## 2020-12-01 MED ORDER — LANTUS SOLOSTAR 100 UNIT/ML ~~LOC~~ SOPN: 14.0000 [IU] | PEN_INJECTOR | Freq: Every day | SUBCUTANEOUS | 1 refills | Status: AC

## 2020-12-01 MED ORDER — METFORMIN HCL 1000 MG PO TABS: 1000.0000 mg | ORAL_TABLET | Freq: Two times a day (BID) | ORAL | 3 refills | Status: AC

## 2020-12-01 NOTE — Patient Instructions (Addendum)
Please continue:  - Ozempic 0.5 mg weekly  - Lantus 14 units at bedtime  Please restart: - Metformin 1000 mg with dinner x 4 -5 days, then increase to 2000 mg daily  Please return in 3-4 months with your sugar log.

## 2020-12-01 NOTE — Progress Notes (Signed)
Patient ID: Elijah Chen, male   DOB: 1946/02/14, 75 y.o.   MRN: [id number]  This visit occurred during the SARS-CoV-2 public health emergency.  Safety protocols were in place, including screening questions prior to the visit, additional usage of staff PPE, and extensive cleaning of exam room while observing appropriate contact time as indicated for disinfecting solutions.   HPI: Elijah Chen is a 75 y.o.-year-old male, returning for f/u for DM2, dx in 2006, insulin-dependent since dx, uncontrolled, with complications (peripheral neuropathy). He moved here from [place], where he was followed by endocrinology.  He was not completely compliant with visits in the past.  Last visit 4 months ago.  He is here with his wife offers almost all of the history including about diet, medications, and blood sugars.  Interim hx: He is now on a keto diet  - <30 g carbs per day most days. He lost >15 lbs since he started. Of note,  he has a history of penile cancer.  He had chemotherapy.  He initially lost 20 pounds after his cancer diagnosis.  Reviewed HbA1c levels: 10/29/2020: HbA1c 8.4% Lab Results  Component Value Date   HGBA1C 9.6 (A) 08/05/2020   HGBA1C 11.0 (A) 03/28/2019   HGBA1C 7.5 (A) 03/21/2018  03/07/2018: HbA1c 7.8%. 05/11/2017: HbA1c 9.8% 04/2015: HbA1c 13% 2015: Hba1c 7%  Pt was prev. on: - Metformin 2000 mg with dinner - Ozempic 0.5 mg weekly - Lantus 48 units in the evening - Novolog 15 min before meals: 14 to 16 units  At last visit, he was only on: -NovoLog 16 units before meals As he ran out all of his other medications. I advised him to start the rest of the meds.  Today:  - Ozempic 0.5 mg weekly  - Lantus 14 units at bedtime  - Novolog as needed  Pt checks his sugars 3-4x a day: - am: 88-180, 190, 224 >> 175-230 >> 112-376 >> 184-238 >> 113-198, 208 - 2h after b'fast:  150s >> 252 >> n/c >> 137-301 >> n/c >> 270 >> n/c - before lunch: 194-164 >> 304, 334 >> n/c >>  170, 176, 191 >> n/c - 2h after lunch: 78, 158-287 >> n/c >> 92, 137 >> n/c >> 160-170 - before dinner: n/c >>  82, 89-182 >> 93, 231-385 >> n/c >> 74 >> n/c - 2h after dinner: 53, 54, 64, 67, 113-185 >> 92-263 >> n/c >> 100, 162 >> 140-150 - bedtime:140-150s >> n/c >> 70, 126-165 >> n/c - nighttime: n/c Lowest sugar was 47 (in hospital, during ChTx) >> 50 >> 74 >> 110. He has hypoglycemia awareness in the 70s. Highest sugar was  224 >> 385 >> 376 >> 238 >> 208.  Pt's meals are: - Breakfast: oatmeal + peaches  - Lunch: sandwich - Dinner: soups, meat + veggies or salad - Snacks: occasional sweets (icecream)  -+ CKD: 10/29/2020: Glu 153; BUN/Cr 23/1.19, ACR 152 12/30/2019: Glu 164, BUN/creatinine 17/0.9, GFR 84 01/09/2019: Glu 242 BUN/creatinine 28/0.96, GFR 79 03/19/2018: Glu 160, BUN/creatinine 15/0.84, GFR 87 Lab Results  Component Value Date   BUN 16 09/08/2017   CREATININE 1.00 09/08/2017  05/11/2017: Glu 186, BUN/creatinine 15/0.92, GFR 81 05/03/2016: 17/1.06 05/21/2015: ACR 3.36 Off Lisinopril.  -+ HL: last Lipid panel: 10/29/2020: 218/94/52/149 Lab Results  Component Value Date   CHOL 206 (H) 03/28/2019   HDL 49.30 03/28/2019   LDLCALC 123 (H) 03/28/2019   TRIG 166.0 (H) 03/28/2019   CHOLHDL 4 03/28/2019  05/11/2017: 150/132/39/85 05/03/2016:  175/142/46/100 Previously on pravastatin 40, now off.  - last eye exam 10/2020: No DR  - + mild numbness and tingling in his feet.  He takes alpha-lipoic acid Bempedoic acid and B12 vitamin  Other labs from PCP - drawn 05/11/2017: TSH 1.64 B12 432  ROS: Constitutional: no weight gain/+ weight loss, no fatigue, no subjective hyperthermia, no subjective hypothermia Eyes: no blurry vision, no xerophthalmia ENT: no sore throat, no nodules palpated in neck, no dysphagia, no odynophagia, no hoarseness Cardiovascular: no CP/no SOB/no palpitations/no leg swelling Respiratory: no cough/no SOB/no wheezing Gastrointestinal: no N/no  V/no D/no C/no acid reflux Musculoskeletal: no muscle aches/no joint aches Skin: no rashes, no hair loss Neurological: no tremors/no numbness/no tingling/no dizziness  I reviewed pt's medications, allergies, PMH, social hx, family hx, and changes were documented in the history of present illness. Otherwise, unchanged from my initial visit note.  Patient Active Problem List   Diagnosis Date Noted  . Peripheral sensory neuropathy due to type 2 diabetes mellitus ([place]) 11/30/2015    Priority: High  . Uncontrolled type 2 diabetes mellitus with peripheral neuropathy ([place]) 09/04/2015    Priority: High  . Hyperlipidemia 03/28/2019    Priority: Medium  . Class 1 obesity due to excess calories with serious comorbidity and body mass index (BMI) of 31.0 to 31.9 in adult 08/15/2017    Priority: Medium  . Penis cancer ([place]) 01/04/2018    Social History   Social History  . Marital Status: Married    Spouse Name: N/A   Social History Main Topics  . Smoking status: Never Smoker   . Smokeless tobacco: Not on file  . Alcohol Use: No  . Drug Use: No   Current Outpatient Medications on File Prior to Visit  Medication Sig Dispense Refill  . ALPHA-LIPOIC ACID PO Take 2 capsules by mouth 2 (two) times daily.    . Ascorbic Acid (VITA-C PO) Take 2 tablets by mouth every morning. Power C    . aspirin EC 325 MG tablet Take 325 mg by mouth every 6 (six) hours as needed.    . Coenzyme Q10 (COQ-10) 100 MG CAPS Take 2 capsules by mouth every morning.    Marland Kitchen glucose blood (BAYER CONTOUR TEST) test strip 1 each by Other route daily. Use as instructed DX E11.65, Z79.4 100 each 1  . insulin glargine (LANTUS SOLOSTAR) 100 UNIT/ML Solostar Pen Inject 20-25 Units into the skin at bedtime. 30 mL 1  . Insulin Pen Needle (PEN NEEDLES 31GX5/16") 31G X 8 MM MISC Use needles 4 times a day as directed. DX E11.65, Z79.4 200 each 11  . Misc Natural Products (APPLE CIDER VINEGAR) TABS Take 2 tablets by mouth 2 (two) times  daily.  (Patient not taking: Reported on 08/05/2020)    . NOVOLOG FLEXPEN 100 UNIT/ML FlexPen INJECT 14 TO 16 UNITS SUBCUTANEOUSLY THREE TIMES DAILY WITH MEALS . APPOINTMENT REQUIRED FOR FUTURE REFILLS 45 mL 0  . pravastatin (PRAVACHOL) 40 MG tablet Take 1 tablet (40 mg total) by mouth every morning. 90 tablet 3  . Semaglutide,0.25 or 0.5MG /DOS, (OZEMPIC, 0.25 OR 0.5 MG/DOSE,) 2 MG/1.5ML SOPN INJECT 0.5MG  INTO THE SKIN ONCE A WEEK INTO THE SKIN 4.5 mL 1   No current facility-administered medications on file prior to visit.    Allergies  Allergen Reactions  . Penicillins Hives   No family history of diabetes.  PE: There were no vitals taken for this visit. There is no height or weight on file to calculate BMI. Wt  Readings from Last 3 Encounters:  08/05/20 197 lb 6.4 oz (89.5 kg)  03/28/19 182 lb (82.6 kg)  03/21/18 185 lb 9.6 oz (84.2 kg)  .  He lost more than 15 pounds since last visit Constitutional: overweight, in NAD Eyes: PERRLA, EOMI, no exophthalmos ENT: moist mucous membranes, no thyromegaly, no cervical lymphadenopathy Cardiovascular: RRR, No RG, +1/6 SEM Respiratory: CTA B Gastrointestinal: abdomen soft, NT, ND, BS+ Musculoskeletal: no deformities, strength intact in all 4 Skin: moist, warm, no rashes Neurological: no tremor with outstretched hands, DTR normal in all 4  ASSESSMENT: 1. DM2, insulin-dependent, uncontrolled, with complications - Peripheral neuropathy  2. Overweight  3.  Hyperlipidemia  PLAN:  1. Patient with longstanding, uncontrolled, type 2 diabetes, on rapid acting insulin only at last visit, after he came off the rest of his medicines.  At that time, HbA1c was very high and I advised him to restart metformin, Ozempic, and Lantus.  However, afterwards, they started a weight management program which features a keto diet.  Sugars did start to improve so he continues only on basal insulin and Ozempic with prn NovoLog. -Most recent HbA1c was better, at  8.4%, but still above target.  Reviewing his blood sugars from home, sugars are high in the morning and they improve later in the day.  At this point, I advised him to keep the Ozempic and the Lantus dose the same and add back metformin, which will help with morning sugars.  We will start at a low dose and increase in several days to improve tolerance. - I suggested to:  Patient Instructions  Please continue:  - Ozempic 0.5 mg weekly  - Lantus 14 units at bedtime  Please restart: - Metformin 1000 mg with dinner x 4 -5 days, then increase to 2000 mg daily  Please return in 3-4 months with your sugar log.   - advised to check sugars at different times of the day - 3x a day, rotating check times - advised for yearly eye exams >> he is UTD - return to clinic in 3-4 months  2.  Overweight -He is now on a keto diet with less than 30 g of carbs per day and most days.  He has 3 eggs with cheese every morning. -He lost more than 15 pounds since last visit -He continues on Ozempic, which should also help with weight loss -At this visit, I advised him to-not continued his diet long-term, since this will increase his insulin resistance and also his cardiovascular risk.  3. HL -Reviewed latest lipid panel from last month: LDL higher, at 145.  Possibly related to his keto diet. -On pravastatin 40 mg daily.  Philemon Kingdom, MD PhD [facility] Endocrinology

## 2021-01-04 DIAGNOSIS — R918 Other nonspecific abnormal finding of lung field: Secondary | ICD-10-CM | POA: Diagnosis not present

## 2021-01-04 DIAGNOSIS — C609 Malignant neoplasm of penis, unspecified: Secondary | ICD-10-CM | POA: Diagnosis not present

## 2021-01-04 DIAGNOSIS — Z880 Allergy status to penicillin: Secondary | ICD-10-CM | POA: Diagnosis not present

## 2021-01-04 DIAGNOSIS — Z7984 Long term (current) use of oral hypoglycemic drugs: Secondary | ICD-10-CM | POA: Diagnosis not present

## 2021-01-04 DIAGNOSIS — Z6825 Body mass index (BMI) 25.0-25.9, adult: Secondary | ICD-10-CM | POA: Diagnosis not present

## 2021-01-04 DIAGNOSIS — D649 Anemia, unspecified: Secondary | ICD-10-CM | POA: Diagnosis not present

## 2021-01-04 DIAGNOSIS — Z794 Long term (current) use of insulin: Secondary | ICD-10-CM | POA: Diagnosis not present

## 2021-01-04 DIAGNOSIS — E119 Type 2 diabetes mellitus without complications: Secondary | ICD-10-CM | POA: Diagnosis not present

## 2021-01-04 DIAGNOSIS — Z79899 Other long term (current) drug therapy: Secondary | ICD-10-CM | POA: Diagnosis not present

## 2021-04-09 ENCOUNTER — Ambulatory Visit: Payer: BC Managed Care – PPO | Admitting: Internal Medicine

## 2021-07-11 DIAGNOSIS — E118 Type 2 diabetes mellitus with unspecified complications: Secondary | ICD-10-CM | POA: Diagnosis not present

## 2021-07-21 ENCOUNTER — Other Ambulatory Visit: Payer: Self-pay

## 2021-07-21 NOTE — Telephone Encounter (Signed)
Fax received from [facility] requested a refill on Semglee 100u/Ml Pen Inj.

## 2021-07-22 MED ORDER — INSULIN GLARGINE-YFGN 100 UNIT/ML ~~LOC~~ SOPN: 14.0000 [IU] | PEN_INJECTOR | Freq: Every day | SUBCUTANEOUS | 0 refills | Status: AC

## 2021-07-29 ENCOUNTER — Encounter: Payer: Self-pay | Admitting: Oncology

## 2021-07-29 ENCOUNTER — Other Ambulatory Visit (HOSPITAL_COMMUNITY): Payer: Self-pay

## 2021-07-29 ENCOUNTER — Telehealth: Payer: Self-pay | Admitting: Pharmacy Technician

## 2021-07-29 NOTE — Telephone Encounter (Signed)
Patient Advocate Encounter   Received notification from CoverMyMeds that prior authorization for Insulin Glargine-YFGN is required by his/her insurance [employer].  Per Test Claim: [place] is covered.    Called Pharmacy to process, but they said it had been transferred to a [facility] in [facility].

## 2021-08-09 DIAGNOSIS — Z20822 Contact with and (suspected) exposure to covid-19: Secondary | ICD-10-CM | POA: Diagnosis not present

## 2021-08-11 DIAGNOSIS — E118 Type 2 diabetes mellitus with unspecified complications: Secondary | ICD-10-CM | POA: Diagnosis not present

## 2021-09-08 DIAGNOSIS — E118 Type 2 diabetes mellitus with unspecified complications: Secondary | ICD-10-CM | POA: Diagnosis not present

## 2021-10-08 DIAGNOSIS — R413 Other amnesia: Secondary | ICD-10-CM | POA: Diagnosis not present

## 2021-10-08 DIAGNOSIS — Z8639 Personal history of other endocrine, nutritional and metabolic disease: Secondary | ICD-10-CM | POA: Diagnosis not present

## 2021-10-08 DIAGNOSIS — E1165 Type 2 diabetes mellitus with hyperglycemia: Secondary | ICD-10-CM | POA: Diagnosis not present

## 2021-10-08 DIAGNOSIS — R011 Cardiac murmur, unspecified: Secondary | ICD-10-CM | POA: Diagnosis not present

## 2021-10-09 DIAGNOSIS — E118 Type 2 diabetes mellitus with unspecified complications: Secondary | ICD-10-CM | POA: Diagnosis not present

## 2021-10-11 DIAGNOSIS — Z1159 Encounter for screening for other viral diseases: Secondary | ICD-10-CM | POA: Diagnosis not present

## 2021-10-11 DIAGNOSIS — R419 Unspecified symptoms and signs involving cognitive functions and awareness: Secondary | ICD-10-CM | POA: Diagnosis not present

## 2021-10-11 DIAGNOSIS — Z79899 Other long term (current) drug therapy: Secondary | ICD-10-CM | POA: Diagnosis not present

## 2021-10-22 DIAGNOSIS — Z8639 Personal history of other endocrine, nutritional and metabolic disease: Secondary | ICD-10-CM | POA: Diagnosis not present

## 2021-10-22 DIAGNOSIS — R413 Other amnesia: Secondary | ICD-10-CM | POA: Diagnosis not present

## 2021-10-22 DIAGNOSIS — R011 Cardiac murmur, unspecified: Secondary | ICD-10-CM | POA: Diagnosis not present

## 2021-10-22 DIAGNOSIS — E1165 Type 2 diabetes mellitus with hyperglycemia: Secondary | ICD-10-CM | POA: Diagnosis not present

## 2021-11-26 DIAGNOSIS — F0390 Unspecified dementia without behavioral disturbance: Secondary | ICD-10-CM | POA: Diagnosis not present

## 2021-11-26 DIAGNOSIS — F801 Expressive language disorder: Secondary | ICD-10-CM | POA: Diagnosis not present

## 2021-11-26 DIAGNOSIS — C609 Malignant neoplasm of penis, unspecified: Secondary | ICD-10-CM | POA: Diagnosis not present

## 2021-12-01 DIAGNOSIS — R413 Other amnesia: Secondary | ICD-10-CM | POA: Diagnosis not present

## 2021-12-17 DIAGNOSIS — R4701 Aphasia: Secondary | ICD-10-CM | POA: Diagnosis not present

## 2021-12-17 DIAGNOSIS — R41841 Cognitive communication deficit: Secondary | ICD-10-CM | POA: Diagnosis not present

## 2021-12-17 DIAGNOSIS — F0390 Unspecified dementia without behavioral disturbance: Secondary | ICD-10-CM | POA: Diagnosis not present

## 2021-12-20 DIAGNOSIS — R011 Cardiac murmur, unspecified: Secondary | ICD-10-CM | POA: Diagnosis not present

## 2021-12-20 DIAGNOSIS — R9431 Abnormal electrocardiogram [ECG] [EKG]: Secondary | ICD-10-CM | POA: Diagnosis not present

## 2021-12-24 DIAGNOSIS — I6310 Cerebral infarction due to embolism of unspecified precerebral artery: Secondary | ICD-10-CM | POA: Diagnosis not present

## 2021-12-24 DIAGNOSIS — C609 Malignant neoplasm of penis, unspecified: Secondary | ICD-10-CM | POA: Diagnosis not present

## 2021-12-29 DIAGNOSIS — R4701 Aphasia: Secondary | ICD-10-CM | POA: Diagnosis not present

## 2021-12-29 DIAGNOSIS — F0390 Unspecified dementia without behavioral disturbance: Secondary | ICD-10-CM | POA: Diagnosis not present

## 2021-12-29 DIAGNOSIS — R41841 Cognitive communication deficit: Secondary | ICD-10-CM | POA: Diagnosis not present

## 2021-12-31 DIAGNOSIS — Z452 Encounter for adjustment and management of vascular access device: Secondary | ICD-10-CM | POA: Diagnosis not present

## 2021-12-31 DIAGNOSIS — C609 Malignant neoplasm of penis, unspecified: Secondary | ICD-10-CM | POA: Diagnosis not present

## 2022-01-03 DIAGNOSIS — C609 Malignant neoplasm of penis, unspecified: Secondary | ICD-10-CM | POA: Diagnosis not present

## 2022-01-03 DIAGNOSIS — Z452 Encounter for adjustment and management of vascular access device: Secondary | ICD-10-CM | POA: Diagnosis not present

## 2022-01-03 DIAGNOSIS — E119 Type 2 diabetes mellitus without complications: Secondary | ICD-10-CM | POA: Diagnosis not present

## 2022-01-07 DIAGNOSIS — F0390 Unspecified dementia without behavioral disturbance: Secondary | ICD-10-CM | POA: Diagnosis not present

## 2022-01-07 DIAGNOSIS — R41841 Cognitive communication deficit: Secondary | ICD-10-CM | POA: Diagnosis not present

## 2022-01-07 DIAGNOSIS — R4701 Aphasia: Secondary | ICD-10-CM | POA: Diagnosis not present

## 2022-01-20 DIAGNOSIS — R9431 Abnormal electrocardiogram [ECG] [EKG]: Secondary | ICD-10-CM | POA: Diagnosis not present

## 2022-01-20 DIAGNOSIS — R011 Cardiac murmur, unspecified: Secondary | ICD-10-CM | POA: Diagnosis not present

## 2022-01-20 DIAGNOSIS — I350 Nonrheumatic aortic (valve) stenosis: Secondary | ICD-10-CM | POA: Diagnosis not present

## 2022-01-20 DIAGNOSIS — Z8549 Personal history of malignant neoplasm of other male genital organs: Secondary | ICD-10-CM | POA: Diagnosis not present

## 2022-01-21 DIAGNOSIS — R4701 Aphasia: Secondary | ICD-10-CM | POA: Diagnosis not present

## 2022-01-21 DIAGNOSIS — Z8639 Personal history of other endocrine, nutritional and metabolic disease: Secondary | ICD-10-CM | POA: Diagnosis not present

## 2022-01-21 DIAGNOSIS — R41841 Cognitive communication deficit: Secondary | ICD-10-CM | POA: Diagnosis not present

## 2022-01-21 DIAGNOSIS — R413 Other amnesia: Secondary | ICD-10-CM | POA: Diagnosis not present

## 2022-01-21 DIAGNOSIS — E1165 Type 2 diabetes mellitus with hyperglycemia: Secondary | ICD-10-CM | POA: Diagnosis not present

## 2022-01-21 DIAGNOSIS — F0390 Unspecified dementia without behavioral disturbance: Secondary | ICD-10-CM | POA: Diagnosis not present

## 2022-01-21 DIAGNOSIS — R011 Cardiac murmur, unspecified: Secondary | ICD-10-CM | POA: Diagnosis not present

## 2022-02-11 DIAGNOSIS — F801 Expressive language disorder: Secondary | ICD-10-CM | POA: Diagnosis not present

## 2022-02-11 DIAGNOSIS — F0390 Unspecified dementia without behavioral disturbance: Secondary | ICD-10-CM | POA: Diagnosis not present

## 2022-02-11 DIAGNOSIS — Z1389 Encounter for screening for other disorder: Secondary | ICD-10-CM | POA: Diagnosis not present

## 2022-02-18 DIAGNOSIS — Z7984 Long term (current) use of oral hypoglycemic drugs: Secondary | ICD-10-CM | POA: Diagnosis not present

## 2022-02-18 DIAGNOSIS — Z794 Long term (current) use of insulin: Secondary | ICD-10-CM | POA: Diagnosis not present

## 2022-02-18 DIAGNOSIS — E119 Type 2 diabetes mellitus without complications: Secondary | ICD-10-CM | POA: Diagnosis not present

## 2022-02-18 DIAGNOSIS — I2510 Atherosclerotic heart disease of native coronary artery without angina pectoris: Secondary | ICD-10-CM | POA: Diagnosis not present

## 2022-02-18 DIAGNOSIS — I272 Pulmonary hypertension, unspecified: Secondary | ICD-10-CM | POA: Diagnosis not present

## 2022-02-18 DIAGNOSIS — I350 Nonrheumatic aortic (valve) stenosis: Secondary | ICD-10-CM | POA: Diagnosis not present

## 2022-02-21 DIAGNOSIS — Z794 Long term (current) use of insulin: Secondary | ICD-10-CM | POA: Diagnosis not present

## 2022-02-21 DIAGNOSIS — I272 Pulmonary hypertension, unspecified: Secondary | ICD-10-CM | POA: Diagnosis not present

## 2022-02-21 DIAGNOSIS — E119 Type 2 diabetes mellitus without complications: Secondary | ICD-10-CM | POA: Diagnosis not present

## 2022-02-21 DIAGNOSIS — Z7984 Long term (current) use of oral hypoglycemic drugs: Secondary | ICD-10-CM | POA: Diagnosis not present

## 2022-02-21 DIAGNOSIS — I2510 Atherosclerotic heart disease of native coronary artery without angina pectoris: Secondary | ICD-10-CM | POA: Diagnosis not present

## 2022-02-21 DIAGNOSIS — I350 Nonrheumatic aortic (valve) stenosis: Secondary | ICD-10-CM | POA: Diagnosis not present

## 2022-03-16 DIAGNOSIS — R011 Cardiac murmur, unspecified: Secondary | ICD-10-CM | POA: Diagnosis not present

## 2022-03-16 DIAGNOSIS — R9431 Abnormal electrocardiogram [ECG] [EKG]: Secondary | ICD-10-CM | POA: Diagnosis not present

## 2022-03-16 DIAGNOSIS — I2510 Atherosclerotic heart disease of native coronary artery without angina pectoris: Secondary | ICD-10-CM | POA: Diagnosis not present

## 2022-03-16 DIAGNOSIS — I350 Nonrheumatic aortic (valve) stenosis: Secondary | ICD-10-CM | POA: Diagnosis not present

## 2022-05-03 DIAGNOSIS — E1165 Type 2 diabetes mellitus with hyperglycemia: Secondary | ICD-10-CM | POA: Diagnosis not present

## 2022-05-03 DIAGNOSIS — Z8639 Personal history of other endocrine, nutritional and metabolic disease: Secondary | ICD-10-CM | POA: Diagnosis not present

## 2022-05-03 DIAGNOSIS — Z8549 Personal history of malignant neoplasm of other male genital organs: Secondary | ICD-10-CM | POA: Diagnosis not present

## 2022-05-03 DIAGNOSIS — Z79899 Other long term (current) drug therapy: Secondary | ICD-10-CM | POA: Diagnosis not present

## 2022-05-05 DIAGNOSIS — R011 Cardiac murmur, unspecified: Secondary | ICD-10-CM | POA: Diagnosis not present

## 2022-05-05 DIAGNOSIS — Z955 Presence of coronary angioplasty implant and graft: Secondary | ICD-10-CM | POA: Diagnosis not present

## 2022-05-05 DIAGNOSIS — R413 Other amnesia: Secondary | ICD-10-CM | POA: Diagnosis not present

## 2022-05-05 DIAGNOSIS — E782 Mixed hyperlipidemia: Secondary | ICD-10-CM | POA: Diagnosis not present

## 2022-05-05 DIAGNOSIS — E1165 Type 2 diabetes mellitus with hyperglycemia: Secondary | ICD-10-CM | POA: Diagnosis not present

## 2022-05-27 ENCOUNTER — Telehealth: Payer: Self-pay

## 2022-05-27 NOTE — Patient Outreach (Signed)
  Care Coordination   05/27/2022 Name: Elijah Chen MRN: [id number] DOB: Jul 20, 1945   Care Coordination Outreach Attempts:  An unsuccessful telephone outreach was attempted today to offer the patient information about available care coordination services as a benefit of their health plan.   Follow Up Plan:  Additional outreach attempts will be made to offer the patient care coordination information and services.   Encounter Outcome:  No Answer  Care Coordination Interventions Activated:  No   Care Coordination Interventions:  No, not indicated   Peter Garter RN, BSN,CCM, [place] [facility] [phone]

## 2022-06-07 DIAGNOSIS — H2513 Age-related nuclear cataract, bilateral: Secondary | ICD-10-CM | POA: Diagnosis not present

## 2022-06-13 DIAGNOSIS — I2510 Atherosclerotic heart disease of native coronary artery without angina pectoris: Secondary | ICD-10-CM | POA: Diagnosis not present

## 2022-06-13 DIAGNOSIS — I350 Nonrheumatic aortic (valve) stenosis: Secondary | ICD-10-CM | POA: Diagnosis not present

## 2022-06-15 ENCOUNTER — Telehealth: Payer: Self-pay

## 2022-06-15 NOTE — Patient Outreach (Signed)
  Care Coordination   Initial Visit Note   06/15/2022 Name: Elijah Chen MRN: [id number] DOB: 07-02-1946  Elijah Chen is a 76 y.o. year old male who sees Leighton Ruff, MD (Inactive) for primary care. I  Spoke with wife Elijah Chen DPR  What matters to the patients health and wellness today?  States they have moved to [facility] and pt now has new PCP, neurologist and endocrinologist     Goals Addressed             This Visit's Progress    COMPLETED: Care Coordination Activities - no follow up required       Care Coordination Interventions: Provided education to patient re: looking up caregiver resources in [place] new state [place] Assessed social determinant of health barriers          SDOH assessments and interventions completed:  Yes  SDOH Interventions Today    Flowsheet Row Most Recent Value  SDOH Interventions   Food Insecurity Interventions Intervention Not Indicated  Housing Interventions Intervention Not Indicated  Transportation Interventions Intervention Not Indicated        Care Coordination Interventions:  Yes, provided   Follow up plan: No further intervention required.   Encounter Outcome:  Pt. Visit Completed  Peter Garter RN, BSN,CCM, CDE Care Management Coordinator [place] [facility] [phone]

## 2022-06-15 NOTE — Patient Instructions (Signed)
Visit Information  Thank you for taking time to visit with me today. Please don't hesitate to contact me if I can be of assistance to you.   Following are the goals we discussed today:   Goals Addressed             This Visit's Progress    COMPLETED: Care Coordination Activities - no follow up required       Care Coordination Interventions: Provided education to patient re: looking up caregiver resources in their new state [place] Assessed social determinant of health barriers         If you are experiencing a Mental Health or [place] or need someone to talk to, please call the Suicide and Crisis Lifeline: 988 call the [place] National Suicide Prevention Lifeline: 778-515-2635 or TTY: 416-413-5272 TTY 9281878240) to talk to a trained counselor call 1-800-273-TALK (toll free, 24 hour hotline) call 911   The patient verbalized understanding of instructions, educational materials, and care plan provided today and DECLINED offer to receive copy of patient instructions, educational materials, and care plan.   No further follow up required:    Peter Garter RN, [place], [place] [facility] [phone]

## 2022-06-15 NOTE — Patient Outreach (Signed)
  Care Coordination   06/15/2022 Name: Elijah Chen MRN: [id number] DOB: Jan 25, 1946   Care Coordination Outreach Attempts:  A second unsuccessful outreach was attempted today to offer the patient with information about available care coordination services as a benefit of their health plan.     Follow Up Plan:  Additional outreach attempts will be made to offer the patient care coordination information and services.   Encounter Outcome:  No Answer   Care Coordination Interventions:  No, not indicated    Peter Garter RN, BSN,CCM, CDE Care Management Coordinator [place] [facility] [phone]

## 2022-06-24 DIAGNOSIS — I350 Nonrheumatic aortic (valve) stenosis: Secondary | ICD-10-CM | POA: Diagnosis not present

## 2022-06-24 DIAGNOSIS — I2510 Atherosclerotic heart disease of native coronary artery without angina pectoris: Secondary | ICD-10-CM | POA: Diagnosis not present
